# Patient Record
Sex: Male | Born: 1965 | Race: White | Hispanic: No | Smoking: Former smoker
Health system: Southern US, Community
[De-identification: ages and names within clinical notes are randomized; demographics above are authoritative.]

## PROBLEM LIST (undated history)

## (undated) DIAGNOSIS — I10 Essential (primary) hypertension: Secondary | ICD-10-CM

## (undated) DIAGNOSIS — M5430 Sciatica, unspecified side: Secondary | ICD-10-CM

## (undated) DIAGNOSIS — E785 Hyperlipidemia, unspecified: Secondary | ICD-10-CM

## (undated) DIAGNOSIS — M549 Dorsalgia, unspecified: Secondary | ICD-10-CM

## (undated) HISTORY — DX: Hyperlipidemia, unspecified: E78.5

## (undated) HISTORY — DX: Essential (primary) hypertension: I10

---

## 2010-07-08 ENCOUNTER — Emergency Department: Payer: Self-pay | Admitting: Emergency Medicine

## 2012-09-13 ENCOUNTER — Encounter (HOSPITAL_COMMUNITY): Payer: Self-pay | Admitting: *Deleted

## 2012-09-13 ENCOUNTER — Emergency Department (HOSPITAL_COMMUNITY): Payer: BC Managed Care – PPO

## 2012-09-13 ENCOUNTER — Other Ambulatory Visit: Payer: Self-pay

## 2012-09-13 ENCOUNTER — Observation Stay (HOSPITAL_COMMUNITY)
Admission: EM | Admit: 2012-09-13 | Discharge: 2012-09-13 | Disposition: A | Discharge status: Home / Self Care | Payer: BC Managed Care – PPO | Attending: Emergency Medicine | Admitting: Emergency Medicine

## 2012-09-13 ENCOUNTER — Observation Stay (HOSPITAL_COMMUNITY): Payer: BC Managed Care – PPO

## 2012-09-13 DIAGNOSIS — R209 Unspecified disturbances of skin sensation: Secondary | ICD-10-CM

## 2012-09-13 DIAGNOSIS — E785 Hyperlipidemia, unspecified: Secondary | ICD-10-CM

## 2012-09-13 DIAGNOSIS — Z79899 Other long term (current) drug therapy: Secondary | ICD-10-CM | POA: Insufficient documentation

## 2012-09-13 DIAGNOSIS — Z7982 Long term (current) use of aspirin: Secondary | ICD-10-CM | POA: Insufficient documentation

## 2012-09-13 DIAGNOSIS — G459 Transient cerebral ischemic attack, unspecified: Secondary | ICD-10-CM

## 2012-09-13 DIAGNOSIS — M549 Dorsalgia, unspecified: Secondary | ICD-10-CM | POA: Insufficient documentation

## 2012-09-13 DIAGNOSIS — I10 Essential (primary) hypertension: Secondary | ICD-10-CM | POA: Insufficient documentation

## 2012-09-13 HISTORY — DX: Dorsalgia, unspecified: M54.9

## 2012-09-13 HISTORY — DX: Sciatica, unspecified side: M54.30

## 2012-09-13 LAB — RAPID URINE DRUG SCREEN, HOSP PERFORMED
Amphetamines: NOT DETECTED
Benzodiazepines: NOT DETECTED
Opiates: NOT DETECTED

## 2012-09-13 LAB — URINALYSIS, ROUTINE W REFLEX MICROSCOPIC
Glucose, UA: NEGATIVE mg/dL
Ketones, ur: NEGATIVE mg/dL
Leukocytes, UA: NEGATIVE
Nitrite: NEGATIVE
pH: 6.5 (ref 5.0–8.0)

## 2012-09-13 LAB — POCT I-STAT, CHEM 8
BUN: 16 mg/dL (ref 6–23)
Chloride: 103 mEq/L (ref 96–112)
Creatinine, Ser: 1.3 mg/dL (ref 0.50–1.35)
Glucose, Bld: 110 mg/dL — ABNORMAL HIGH (ref 70–99)
Potassium: 4.2 mEq/L (ref 3.5–5.1)
Sodium: 140 mEq/L (ref 135–145)

## 2012-09-13 LAB — CBC
HCT: 48.4 % (ref 39.0–52.0)
Hemoglobin: 16.3 g/dL (ref 13.0–17.0)
MCV: 93.6 fL (ref 78.0–100.0)
Platelets: 254 10*3/uL (ref 150–400)
RBC: 5.17 MIL/uL (ref 4.22–5.81)
WBC: 8.5 10*3/uL (ref 4.0–10.5)

## 2012-09-13 LAB — LIPID PANEL
HDL: 40 mg/dL (ref >39)
Total CHOL/HDL Ratio: 5.3 RATIO
Triglycerides: 156 mg/dL — ABNORMAL HIGH (ref <150)

## 2012-09-13 LAB — BASIC METABOLIC PANEL
CO2: 27 mEq/L (ref 19–32)
Chloride: 100 mEq/L (ref 96–112)
Glucose, Bld: 105 mg/dL — ABNORMAL HIGH (ref 70–99)
Potassium: 4.1 mEq/L (ref 3.5–5.1)
Sodium: 139 mEq/L (ref 135–145)

## 2012-09-13 LAB — POCT I-STAT TROPONIN I

## 2012-09-13 LAB — TROPONIN I: Troponin I: <0.3 ng/mL (ref <0.30)

## 2012-09-13 MED ORDER — ATORVASTATIN CALCIUM 10 MG PO TABS: 10.0000 mg | ORAL_TABLET | Freq: Every day | ORAL | Status: DC

## 2012-09-13 MED ORDER — ASPIRIN 81 MG PO CHEW
162.0000 mg | CHEWABLE_TABLET | Freq: Once | ORAL | Status: AC
  Administered 2012-09-13: 162 mg via ORAL
  Filled 2012-09-13: qty 3

## 2012-09-13 NOTE — Progress Notes (Signed)
  Echocardiogram 2D Echocardiogram has been performed.  [place] 09/13/2012, 3:00 PM

## 2012-09-13 NOTE — Progress Notes (Signed)
*  PRELIMINARY RESULTS* Vascular Ultrasound Carotid Duplex (Doppler) has been completed.  There is no obvious evidence of extracranial carotid artery stenosis. Vertebral arteries are patent with antegrade flow.  [employer], RVS 09/13/2012

## 2012-09-13 NOTE — ED Provider Notes (Signed)
Patient to move to CDU under observation, TIA protocol.  This is a shared visit with Dr Rhunette Croft.  Patient with numbness and tingling along the entire left side of his body.  This has resolved.  ABCD2 score is 2.    2:08 PM Patient has arrived to CDU and taken directly to vascular lab.    3:23 PM Discussed patient with Fayrene Helper, PA-C, who assumes care of patient at change of shift.     [place], [place] 09/13/12 1523

## 2012-09-13 NOTE — ED Notes (Addendum)
Lt. Arm numbness. Tightness, upper lt. Shoulder. Hx. Of sciatic nerve injury. Twitching throughout body. 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 MRI this past week for lower back pain; given flexeril.

## 2012-09-13 NOTE — ED Notes (Signed)
On arrival to room A-13 pt reported he had Lt facial and Lt arm tingling. 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 Pt reported on his last jump he jerked his neck hard. Pt now works as a Architectural technologist.

## 2012-09-13 NOTE — ED Provider Notes (Signed)
TIA protocol due to L side paresthesia that has resolved.  NIH score is 0, ABCD score of 2.  Currently awaits imaging.    Patient's workup is only remarkable for mildly elevated total cholesterol of 210, and an LDL of 139.  Normal Head/Brain MRI/MRA.  Normal cardiac 2d echo.  The remainder of work up has been unremarkable.  He is currently sxs free. On examination he appeared in good health and spirits. Vital signs as documented. Skin warm and dry and without overt rashes. Neck without JVD. Lungs clear. Heart exam notable for regular rhythm, normal sounds and absence of murmurs, rubs or gallops. Abdomen unremarkable and without evidence of organomegaly, masses, or abdominal aortic enlargement. Extremities nonedematous. Neurologically, the patient was awake, alert, and oriented to person, place and time. There were no obvious focal neurologic abnormalities. Pt will be discharged with statin medication, and recommend f/u with PCP for further care.  Neurology referral given as well.  Patient voiced understanding and agrees with plan  BP 132/94  Pulse 94  Temp 98.6 F (37 C) (Oral)  Resp 14  SpO2 97%    Author:  Gwendolyn Fill  Service:  Vascular Lab  Author Type:  Cardiovascular Sonographer   Filed:  09/13/12 1620  Note Time:  09/13/12 1619          *PRELIMINARY RESULTS*  Vascular Ultrasound  Carotid Duplex (Doppler) has been completed.  There is no obvious evidence of extracranial carotid artery stenosis. Vertebral arteries are patent with antegrade flow.  [employer], RVS  09/13/2012       Results for orders placed during the hospital encounter of 09/13/12  CBC      Component Value Range   WBC 8.5  4.0 - 10.5 K/uL   RBC 5.17  4.22 - 5.81 MIL/uL   Hemoglobin 16.3  13.0 - 17.0 g/dL   HCT 96.0  45.4 - 09.8 %   MCV 93.6  78.0 - 100.0 fL   MCH 31.5  26.0 - 34.0 pg   MCHC 33.7  30.0 - 36.0 g/dL   RDW 11.9  14.7 - 82.9 %   Platelets 254  150 - 400 K/uL  POCT I-STAT, CHEM 8        Component Value Range   Sodium 140  135 - 145 mEq/L   Potassium 4.2  3.5 - 5.1 mEq/L   Chloride 103  96 - 112 mEq/L   BUN 16  6 - 23 mg/dL   Creatinine, Ser 5.62  0.50 - 1.35 mg/dL   Glucose, Bld 130 (*) 70 - 99 mg/dL   Calcium, Ion 8.65  1.12 - 1.23 mmol/L   TCO2 24  0 - 100 mmol/L   Hemoglobin 17.3 (*) 13.0 - 17.0 g/dL   HCT 78.4  69.6 - 29.5 %  POCT I-STAT TROPONIN I      Component Value Range   Troponin i, poc 0.01  0.00 - 0.08 ng/mL   Comment 3           BASIC METABOLIC PANEL      Component Value Range   Sodium 139  135 - 145 mEq/L   Potassium 4.1  3.5 - 5.1 mEq/L   Chloride 100  96 - 112 mEq/L   CO2 27  19 - 32 mEq/L   Glucose, Bld 105 (*) 70 - 99 mg/dL   BUN 15  6 - 23 mg/dL   Creatinine, Ser 2.84  0.50 - 1.35 mg/dL   Calcium 9.4  8.4 - 10.5 mg/dL   GFR calc non Af Amer 67 (*) >90 mL/min   GFR calc Af Amer 78 (*) >90 mL/min  TROPONIN I      Component Value Range   Troponin I <0.30  <0.30 ng/mL  URINALYSIS, ROUTINE W REFLEX MICROSCOPIC      Component Value Range   Color, Urine YELLOW  YELLOW   APPearance CLEAR  CLEAR   Specific Gravity, Urine 1.016  1.005 - 1.030   pH 6.5  5.0 - 8.0   Glucose, UA NEGATIVE  NEGATIVE mg/dL   Hgb urine dipstick NEGATIVE  NEGATIVE   Bilirubin Urine NEGATIVE  NEGATIVE   Ketones, ur NEGATIVE  NEGATIVE mg/dL   Protein, ur NEGATIVE  NEGATIVE mg/dL   Urobilinogen, UA 0.2  0.0 - 1.0 mg/dL   Nitrite NEGATIVE  NEGATIVE   Leukocytes, UA NEGATIVE  NEGATIVE  URINE RAPID DRUG SCREEN (HOSP PERFORMED)      Component Value Range   Opiates NONE DETECTED  NONE DETECTED   Cocaine NONE DETECTED  NONE DETECTED   Benzodiazepines NONE DETECTED  NONE DETECTED   Amphetamines NONE DETECTED  NONE DETECTED   Tetrahydrocannabinol NONE DETECTED  NONE DETECTED   Barbiturates NONE DETECTED  NONE DETECTED  LIPID PANEL      Component Value Range   Cholesterol 210 (*) 0 - 200 mg/dL   Triglycerides 960 (*) <150 mg/dL   HDL 40  >45 mg/dL   Total CHOL/HDL  Ratio 5.3     VLDL 31  0 - 40 mg/dL   LDL Cholesterol 409 (*) 0 - 99 mg/dL   Dg Chest 2 View  81/19/1478  *RADIOLOGY REPORT*  Clinical Data: Hypertension, numbness, tingling in left body.  CHEST - 2 VIEW  Comparison: None.  Findings: Heart and mediastinal contours are within normal limits. No focal opacities or effusions.  No acute bony abnormality.  IMPRESSION: No active cardiopulmonary disease.   Original Report Authenticated By: Cyndie Chime, M.D.    Ct Head Wo Contrast  09/13/2012  *RADIOLOGY REPORT*  Clinical Data:  Left facial and left arm tingling.  CT HEAD WITHOUT CONTRAST  Technique: Contiguous axial images were obtained from the base of the skull through the vertex without intravenous contrast.  Comparison:   None.  Findings:  Ventricle size is normal.  Negative for acute or chronic infarct.  Negative for hemorrhage or mass.  Calvarium is intact.  IMPRESSION: Negative exam.   Original Report Authenticated By: Camelia Phenes, M.D.    Mr Brain Wo Contrast  09/13/2012  *RADIOLOGY REPORT*  Clinical Data:  Left sided numbness.  Hypertension.  Rule out CVA  MRI HEAD WITHOUT CONTRAST MRA HEAD WITHOUT CONTRAST  Technique:  Multiplanar, multiecho pulse sequences of the brain and surrounding structures were obtained without intravenous contrast. Angiographic images of the head were obtained using MRA technique without contrast.  Comparison:  CT 09/13/2012  MRI HEAD  Findings:  Negative for acute infarct.  Negative for chronic ischemia.  No evidence of demyelinating disease.  Cerebral white matter is normal.  The basal ganglia and brainstem are normal.  Negative for intracranial hemorrhage or fluid collection.  No mass or edema is present in the brain.  No shift of the midline structures.  Mild mucosal edema in the paranasal sinuses.  IMPRESSION: Normal MRI of the brain  Mild chronic sinusitis  MRA HEAD  Findings: Dominant left vertebral artery.  Hypoplastic right vertebral artery is patent to the  basilar.  PICA is  patent bilaterally.  The basilar is patent.  Fetal origin of the posterior cerebral artery with hypoplastic right P1 segment.  Superior cerebellar and posterior cerebral arteries are patent bilaterally.  Internal carotid artery is widely patent bilaterally without stenosis.  Anterior and middle cerebral arteries are patent bilaterally.  Negative for cerebral aneurysm.  IMPRESSION: Negative   Original Report Authenticated By: Camelia Phenes, M.D.    Mr Mra Head/brain Wo Cm  09/13/2012  *RADIOLOGY REPORT*  Clinical Data:  Left sided numbness.  Hypertension.  Rule out CVA  MRI HEAD WITHOUT CONTRAST MRA HEAD WITHOUT CONTRAST  Technique:  Multiplanar, multiecho pulse sequences of the brain and surrounding structures were obtained without intravenous contrast. Angiographic images of the head were obtained using MRA technique without contrast.  Comparison:  CT 09/13/2012  MRI HEAD  Findings:  Negative for acute infarct.  Negative for chronic ischemia.  No evidence of demyelinating disease.  Cerebral white matter is normal.  The basal ganglia and brainstem are normal.  Negative for intracranial hemorrhage or fluid collection.  No mass or edema is present in the brain.  No shift of the midline structures.  Mild mucosal edema in the paranasal sinuses.  IMPRESSION: Normal MRI of the brain  Mild chronic sinusitis  MRA HEAD  Findings: Dominant left vertebral artery.  Hypoplastic right vertebral artery is patent to the basilar.  PICA is patent bilaterally.  The basilar is patent.  Fetal origin of the posterior cerebral artery with hypoplastic right P1 segment.  Superior cerebellar and posterior cerebral arteries are patent bilaterally.  Internal carotid artery is widely patent bilaterally without stenosis.  Anterior and middle cerebral arteries are patent bilaterally.  Negative for cerebral aneurysm.  IMPRESSION: Negative   Original Report Authenticated By: Camelia Phenes, M.D.       Fayrene Helper,  PA-C 09/13/12 1659

## 2012-09-13 NOTE — ED Notes (Signed)
PATIENT IS ALERT AND ORIENTED. UPON ARRIVAL TO CDU PT TAKEN IMMEDIATELY TO VASCULAR LAB FOR STUDIES. THEY ARE GOING TO CHECK WITH MRI ALSO TO SEE IF THEY WILL BE READY FOR PT

## 2012-09-13 NOTE — ED Provider Notes (Signed)
History     CSN: [id number]  Arrival date & time 09/13/12  0945   First MD Initiated Contact with Patient 09/13/12 1039      Chief Complaint  Patient presents with  . Hypertension  . Numbness    (Consider location/radiation/quality/duration/timing/severity/associated sxs/prior treatment) HPI Comments: Pt comes in with cc of numbness. Has no medical problems. Pt reports that around 9 am, he was getting ready to go for a hair cut, and started having numbness in the entire left side and some tingling. His sx have subsided, and now he has numbness only to the leftupper extremity. The sx lasted at least 15 minutes before startign to subside. He has no weakness, no substance abuse hx.  Patient is a 46 y.o. male presenting with hypertension. The history is provided by the patient.  Hypertension Pertinent negatives include no chest pain, no abdominal pain and no shortness of breath.    Past Medical History  Diagnosis Date  . Back pain   . Sciatic nerve pain     No past surgical history on file.  No family history on file.  History  Substance Use Topics  . Smoking status: Former Games developer  . Smokeless tobacco: Not on file  . Alcohol Use: Yes      Review of Systems  Constitutional: Negative for activity change and appetite change.  Respiratory: Negative for cough and shortness of breath.   Cardiovascular: Negative for chest pain.  Gastrointestinal: Negative for abdominal pain.  Genitourinary: Negative for dysuria.  Neurological: Positive for numbness.    Allergies  Sulfa antibiotics  Home Medications   Current Outpatient Rx  Name Route Sig Dispense Refill  . ASPIRIN 325 MG PO TABS Oral Take 325 mg by mouth once as needed. For pain    . CYCLOBENZAPRINE HCL 10 MG PO TABS Oral Take 10 mg by mouth 3 (three) times daily as needed. For muscle spasms.      BP 139/98  Pulse 91  Temp 97.9 F (36.6 C) (Oral)  Resp 13  SpO2 97%  Physical Exam  Nursing note and vitals  reviewed. Constitutional: He is oriented to person, place, and time. He appears well-developed and well-nourished.  HENT:  Head: Normocephalic and atraumatic.  Eyes: Conjunctivae normal and EOM are normal. Pupils are equal, round, and reactive to light.  Neck: Normal range of motion. Neck supple. No JVD present.  Cardiovascular: Normal rate and regular rhythm.   Pulmonary/Chest: Effort normal and breath sounds normal. No respiratory distress. He has no wheezes.  Abdominal: Soft. Bowel sounds are normal. He exhibits no distension. There is no tenderness. There is no rebound and no guarding.  Neurological: He is alert and oriented to person, place, and time. No cranial nerve deficit. Coordination normal.       NIHSS - 0 No objective sensory deficits, Motor strength upper and lower extremity 4+ and equal Normal cerebellar exam Pt has some tingling on the left upper extrermity  Skin: Skin is warm and dry.    ED Course  Procedures (including critical care time)  Labs Reviewed  POCT I-STAT, CHEM 8 - Abnormal; Notable for the following:    Glucose, Bld 110 (*)     Hemoglobin 17.3 (*)     All other components within normal limits  CBC  POCT I-STAT TROPONIN I  BASIC METABOLIC PANEL  TROPONIN I  URINALYSIS, ROUTINE W REFLEX MICROSCOPIC  URINE RAPID DRUG SCREEN (HOSP PERFORMED)   Dg Chest 2 View  09/13/2012  *RADIOLOGY REPORT*  Clinical Data: Hypertension, numbness, tingling in left body.  CHEST - 2 VIEW  Comparison: None.  Findings: Heart and mediastinal contours are within normal limits. No focal opacities or effusions.  No acute bony abnormality.  IMPRESSION: No active cardiopulmonary disease.   Original Report Authenticated By: Cyndie Chime, M.D.      No diagnosis found.    MDM  DDx includes:  Stroke - ischemic vs. hemorrhagic TIA Neuropathy Electrolyte abnormality Neuropathy  Pt comes in with cc of unilateral numbness and tingling. He has hx of neck and back problems,  but never had sx like these before. He is not completely sx free. ABCD2 SCORE IS 2. Will give ASA, and he will get Neuro consult for possible MRI      Derwood Kaplan, MD 09/13/12 1147

## 2012-09-14 LAB — HEMOGLOBIN A1C
Hgb A1c MFr Bld: 5.7 % — ABNORMAL HIGH (ref <5.7)
Mean Plasma Glucose: 117 mg/dL — ABNORMAL HIGH (ref <117)

## 2012-09-19 ENCOUNTER — Encounter (HOSPITAL_COMMUNITY): Payer: Self-pay | Admitting: Emergency Medicine

## 2012-09-19 ENCOUNTER — Emergency Department (HOSPITAL_COMMUNITY)
Admission: EM | Admit: 2012-09-19 | Discharge: 2012-09-19 | Disposition: A | Discharge status: Home / Self Care | Payer: BC Managed Care – PPO | Attending: Emergency Medicine | Admitting: Emergency Medicine

## 2012-09-19 DIAGNOSIS — R209 Unspecified disturbances of skin sensation: Secondary | ICD-10-CM | POA: Insufficient documentation

## 2012-09-19 DIAGNOSIS — R202 Paresthesia of skin: Secondary | ICD-10-CM

## 2012-09-19 DIAGNOSIS — M5430 Sciatica, unspecified side: Secondary | ICD-10-CM | POA: Insufficient documentation

## 2012-09-19 DIAGNOSIS — Z7982 Long term (current) use of aspirin: Secondary | ICD-10-CM | POA: Insufficient documentation

## 2012-09-19 DIAGNOSIS — Z87891 Personal history of nicotine dependence: Secondary | ICD-10-CM | POA: Insufficient documentation

## 2012-09-19 DIAGNOSIS — Z79899 Other long term (current) drug therapy: Secondary | ICD-10-CM | POA: Insufficient documentation

## 2012-09-19 NOTE — ED Provider Notes (Signed)
Medical screening examination/treatment/procedure(s) were conducted as a shared visit with non-physician practitioner(s) and myself.  I personally evaluated the patient during the encounter  Derwood Kaplan, MD 09/19/12 [phone]

## 2012-09-19 NOTE — ED Notes (Signed)
To ED from work via EMS, pt c/o pain from left neck down left side of body onset this AM, EMS reports neg stroke scale, pt also reports similar episode on right side 2d ago, VSS, NAD

## 2012-09-19 NOTE — ED Provider Notes (Signed)
Medical screening examination/treatment/procedure(s) were conducted as a shared visit with non-physician practitioner(s) and myself.  I personally evaluated the patient during the encounter  David Beltre, MD 09/19/12 1508 

## 2012-09-19 NOTE — ED Provider Notes (Signed)
History     CSN: [id number]  Arrival date & time 09/19/12  1032   First MD Initiated Contact with Patient 09/19/12 1052      Chief Complaint  Patient presents with  . Weakness    (Consider location/radiation/quality/duration/timing/severity/associated sxs/prior treatment) HPI CC: tingling in arms and funny feeling  Tingling down arms L and R intermittent since last ED eval on 10/18. Occur at random. Now associated w/ subsequent feelings of dry mouth, nausea, increased heart rate and a general "funny feeling." Also feels like his R side goes numb. Improves w/ lying down. Acute "attack" today at the office. Pt felt attack coming on while at work. Acutely worsened over several minutes, felt like he couldn't walk. Symptoms were severe for about 10 min before becoming only mild. Currently w/ only slight nubmness/tingling in both arms. Denies HA, fever, diarrhea, emesis, rash, syncope, lightheadedness, CP, SOB   Past Medical History  Diagnosis Date  . Back pain   . Sciatic nerve pain     History reviewed. No pertinent past surgical history.  History reviewed. No pertinent family history.  History  Substance Use Topics  . Smoking status: Former Games developer  . Smokeless tobacco: Not on file  . Alcohol Use: Yes      Review of Systems  All other systems reviewed and are negative.    Allergies  Sulfa antibiotics  Home Medications   Current Outpatient Rx  Name Route Sig Dispense Refill  . VITAMIN C PO Oral Take 1 tablet by mouth daily.    . ASPIRIN 325 MG PO TABS Oral Take 325 mg by mouth every 4 (four) hours as needed. For stoke-like syptoms    . CYCLOBENZAPRINE HCL 10 MG PO TABS Oral Take 10 mg by mouth 3 (three) times daily as needed. For muscle spasms.      BP 132/98  Pulse 87  Temp 98 F (36.7 C) (Oral)  Resp 20  SpO2 98%  Physical Exam  Nursing note and vitals reviewed. Constitutional: He is oriented to person, place, and time. He appears well-developed and  well-nourished. No distress.  HENT:  Head: Normocephalic and atraumatic.  Eyes: Pupils are equal, round, and reactive to light.  Neck: Normal range of motion.  Cardiovascular: Normal rate and intact distal pulses.   Pulmonary/Chest: No respiratory distress.  Abdominal: Normal appearance. He exhibits no distension.  Musculoskeletal: Normal range of motion.  Neurological: He is alert and oriented to person, place, and time. No cranial nerve deficit. Coordination normal.  Skin: Skin is warm and dry. No rash noted.  Psychiatric: He has a normal mood and affect. His behavior is normal.    ED Course  Procedures (including critical care time)  Labs Reviewed - No data to display No results found.   No diagnosis found.    MDM   46yo M w/ unusual presentation. Full TIA workup as of 10/18 that was unremarkable. Upper arm numbness/tingling more consistent w/ cervical radiculopathy. Other symptoms more consistent w/ aura type migraine but w/o migraine vs psychosomatic vs early onset MS vs electrolyte abnormality. - f/u PCP - f/u Neuro  Shelly Flatten, MD Family Medicine PGY-2 09/19/2012, 11:31 AM         Ozella Rocks, MD 09/19/12 [phone]

## 2012-09-19 NOTE — ED Provider Notes (Signed)
I saw and evaluated the patient, reviewed the resident's note and I agree with the findings and plan.  Pt reports numbness/tingling in L side of body from head, arm and leg. Onset earlier today while at work. Had similar symptoms on the R a few days ago and seen in the ED. Had a full TIA workup which was neg. He reports he is scheduled for nerve conduction study tomorrow with PCP in [place]. Also advised to follow up with Neurologist for further outpatient eval. No indication for additional ED workup/   Charles B. Bernette Mayers, MD 09/19/12 1239

## 2013-01-29 ENCOUNTER — Encounter: Payer: Self-pay | Admitting: Family Medicine

## 2013-01-29 ENCOUNTER — Ambulatory Visit (INDEPENDENT_AMBULATORY_CARE_PROVIDER_SITE_OTHER): Payer: PRIVATE HEALTH INSURANCE | Admitting: Family Medicine

## 2013-01-29 VITALS — BP 140/88 | HR 80 | Temp 98.6°F | Resp 16 | Ht 71.0 in | Wt 228.5 lb

## 2013-01-29 DIAGNOSIS — M5010 Cervical disc disorder with radiculopathy, unspecified cervical region: Secondary | ICD-10-CM

## 2013-01-29 DIAGNOSIS — IMO0002 Reserved for concepts with insufficient information to code with codable children: Secondary | ICD-10-CM

## 2013-01-29 DIAGNOSIS — M5412 Radiculopathy, cervical region: Secondary | ICD-10-CM

## 2013-01-29 DIAGNOSIS — E785 Hyperlipidemia, unspecified: Secondary | ICD-10-CM

## 2013-01-29 DIAGNOSIS — I10 Essential (primary) hypertension: Secondary | ICD-10-CM

## 2013-01-29 DIAGNOSIS — M5416 Radiculopathy, lumbar region: Secondary | ICD-10-CM

## 2013-01-29 HISTORY — DX: Hyperlipidemia, unspecified: E78.5

## 2013-01-29 MED ORDER — IBUPROFEN 800 MG PO TABS: 800.0000 mg | ORAL_TABLET | Freq: Three times a day (TID) | ORAL | Status: DC | PRN

## 2013-01-29 MED ORDER — AMITRIPTYLINE HCL 10 MG PO TABS: 10.0000 mg | ORAL_TABLET | Freq: Every day | ORAL | Status: DC

## 2013-01-29 NOTE — Progress Notes (Signed)
Nature conservation officer at [facility] [address] [place] [address] Phone: [phone] Fax: [phone]  Date:  01/29/2013   Name:  David Collins   DOB:  January 08, 1966   MRN:  [id number] Gender: male Age: 47 y.o.  Primary Physician:  Hannah Beat, MD  Evaluating MD: Hannah Beat, MD   Chief Complaint: Establish Care   History of Present Illness:  David Collins is a 47 y.o. pleasant patient who presents with the following:  New patient, former patient of Dr. Lacie Scotts who presents as as a new patient, but he also has multiple ongoing significant impairing problems. He is a former Dance movement psychotherapist, and he currently is having some significant paresthesias constantly in the left arm, and he also is having some fasciculations and twitching of his fingers in his right left hand. He also has some subjective weakness in the left arm, more in the bicep. He is having these symptoms almost all the time. He also is having some back pain, and is having some tingling in his lower extremities as well intermittently.  He has been to the emergency room twice, and they have evaluated him for potential CVA, potential demyelinating disease, and he had a normal MRI, normal MRA, and normal CT of the head. He followed up with [facility], and it sounds likely with Dr. Terrace Arabia.   Would have some sensations in his legs like worms.  He also discussed this with Dr. Lacie Scotts, who obtained an EMG that was mild-moderately abnormal with regards to the upper extremities.   He also had cervical and lumbar MRI's done at [facility]. I independently reviewed these myself. There some significant disc herniation at C5-6 and C6-7. These will be big enough to cause some symptoms. There does not appear to be any T2 edema in the spinal cord itself. There is also some very minimal herniation in the 2 levels above this.  On review of the lumbar spine MRI, there does not appear to be any dramatically  significant herniation. And no apparent cord or nerve compression.   CHEST - 2 VIEW   Comparison: None.   Findings: Heart and mediastinal contours are within normal limits. No focal opacities or effusions.  No acute bony abnormality.   IMPRESSION: No active cardiopulmonary disease.     Original Report Authenticated By: Cyndie Chime, M.D. CT HEAD WITHOUT CONTRAST   Technique: Contiguous axial images were obtained from the base of the skull through the vertex without intravenous contrast.   Comparison:   None.   Findings:  Ventricle size is normal.  Negative for acute or chronic infarct.  Negative for hemorrhage or mass.  Calvarium is intact.   IMPRESSION: Negative exam.     Original Report Authenticated By: Camelia Phenes, M.D. Clinical Data:  Left sided numbness.  Hypertension.  Rule out CVA   MRI HEAD WITHOUT CONTRAST MRA HEAD WITHOUT CONTRAST   Technique:  Multiplanar, multiecho pulse sequences of the brain and surrounding structures were obtained without intravenous contrast. Angiographic images of the head were obtained using MRA technique without contrast.   Comparison:  CT 09/13/2012   MRI HEAD   Findings:  Negative for acute infarct.  Negative for chronic ischemia.  No evidence of demyelinating disease.  Cerebral white matter is normal.  The basal ganglia and brainstem are normal.   Negative for intracranial hemorrhage or fluid collection.  No mass or edema is present in the brain.  No shift of the midline structures.   Mild  mucosal edema in the paranasal sinuses.   IMPRESSION: Normal MRI of the brain   Mild chronic sinusitis   MRA HEAD   Findings: Dominant left vertebral artery.  Hypoplastic right vertebral artery is patent to the basilar.  PICA is patent bilaterally.  The basilar is patent.  Fetal origin of the posterior cerebral artery with hypoplastic right P1 segment.  Superior cerebellar and posterior cerebral arteries are patent  bilaterally.   Internal carotid artery is widely patent bilaterally without stenosis.  Anterior and middle cerebral arteries are patent bilaterally.   Negative for cerebral aneurysm.   IMPRESSION: Negative     Original Report Authenticated By: Camelia Phenes, M.D.   Patient Active Problem List  Diagnosis  . Lumbar radiculopathy  . Cervical disc disorder with radiculopathy of cervical region  . Hyperlipidemia  . Hypertension    Past Medical History  Diagnosis Date  . Back pain   . Sciatic nerve pain   . Hyperlipidemia 01/29/2013  . Hypertension     No past surgical history on file.  History   Social History  . Marital Status: Divorced    Spouse Name: N/A    Number of Children: N/A  . Years of Education: N/A   Occupational History  .  Scientist, product/process development   Social History Main Topics  . Smoking status: Former Games developer  . Smokeless tobacco: Not on file  . Alcohol Use: Yes  . Drug Use: No  . Sexually Active:    Other Topics Concern  . 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 Skin cancer      parents    Allergies  Allergen Reactions  . Sulfa Antibiotics Rash    Current Outpatient Prescriptions on File Prior to Visit  Medication Sig Dispense Refill  . Ascorbic Acid (VITAMIN C PO) Take 1 tablet by mouth daily.       No current facility-administered medications on file prior to visit.     Review of Systems:   GEN: No fevers, chills. Nontoxic. Primarily MSK c/o today. MSK: Detailed in the HPI GI: tolerating PO intake without difficulty Neuro: detailed above Otherwise, the pertinent positives and negatives are listed above and in the HPI, otherwise a full review of systems has been reviewed and is negative unless noted positive.   Physical Examination: Filed Vitals:   01/29/13 1357 01/29/13 1811  BP: 140/88 140/88    Pulse: 118 80  Temp: 98.6 F (37 C) 98.6 F (37 C)  TempSrc: Oral Oral  Resp:  16  Height: 5\' 11"  (1.803 m)   Weight: 228 lb 8 oz (103.647 kg)   SpO2: 96%      Ideal Body Weight: Weight in (lb) to have BMI = 25: 178.9   GEN: Well-developed,well-nourished,in no acute distress; alert,appropriate and cooperative throughout examination HEENT: Normocephalic and atraumatic without obvious abnormalities. Ears, externally no deformities PULM: Breathing comfortably in no respiratory distress EXT: No clubbing, cyanosis, or edema PSYCH: Normally interactive. Cooperative during the interview. Pleasant. Friendly and conversant. Not anxious or depressed appearing. Normal, full affect.  CERVICAL SPINE EXAM Range of motion: Flexion, extension, lateral bending, and rotation: relatively preserved Pain with terminal motion: yes Spinous Processes: tender throughout SCM: ttp Upper paracervical muscles: ttp Upper traps: ttp C5-T1 intact, sensation and motor - appears intact to me, but he subjectively feels  weaker in the L UE  Lumbar spine: Moderately tender right and left side in the paraspinous musculature. No obvious deficits are appreciated from a neurovascular standpoint.  Neuro: The patient is having some occasional twitching of his fingers that he shows me.   Assessment and Plan:  Cervical disc disorder with radiculopathy of cervical region - Plan: Ambulatory referral to Neurosurgery  Lumbar radiculopathy - Plan: Ambulatory referral to Neurosurgery  Hyperlipidemia  Hypertension  I am quite concerned with the neuropathic changes of this patient is having given that he is a former Camera operator, but these symptoms are fairly impairing to him right now. He does have a large enough herniation in his cervical spine that I think could be causing these symptoms. I discussed with him that it is not massive and there is clearly no cord compromise.   I started him on some low-dose Elavil to  see if this will help with the neuropathic pain.  Also like to consult neurosurgery to get their opinion if they think anything can be addressed from a procedural standpoint or if he may be more amenable to an epidural steroid injection. Anything that they can suggest that could potentially help this gentleman would be greatly appreciated.  Orders Today:  Orders Placed This Encounter  Procedures  . Ambulatory referral to Neurosurgery    Referral Priority:  Routine    Referral Type:  Surgical    Referral Reason:  Specialty Services Required    Requested Specialty:  Neurosurgery    Number of Visits Requested:  1    Updated Medication List: (Includes new medications, updates to list, dose adjustments) Meds ordered this encounter  Medications  . pantoprazole (PROTONIX) 40 MG tablet    Sig: Take 40 mg by mouth daily.  . simvastatin (ZOCOR) 20 MG tablet    Sig: Take 20 mg by mouth every evening.  Marland Kitchen DISCONTD: meloxicam (MOBIC) 7.5 MG tablet    Sig: Take 7.5 mg by mouth as needed for pain.  . traMADol (ULTRAM) 50 MG tablet    Sig: Take 50 mg by mouth every 6 (six) hours as needed for pain.  Marland Kitchen DISCONTD: gabapentin (NEURONTIN) 400 MG capsule    Sig: Take 400 mg by mouth 3 (three) times daily.  Marland Kitchen amitriptyline (ELAVIL) 10 MG tablet    Sig: Take 1 tablet (10 mg total) by mouth at bedtime.    Dispense:  30 tablet    Refill:  5  . ibuprofen (ADVIL,MOTRIN) 800 MG tablet    Sig: Take 1 tablet (800 mg total) by mouth every 8 (eight) hours as needed for pain.    Dispense:  90 tablet    Refill:  3    Medications Discontinued: Medications Discontinued During This Encounter  Medication Reason  . aspirin 325 MG tablet Error  . cyclobenzaprine (FLEXERIL) 10 MG tablet Error  . gabapentin (NEURONTIN) 400 MG capsule   . meloxicam (MOBIC) 7.5 MG tablet       Signed, Spencer T. Copland, MD 01/29/2013 2:11 PM

## 2013-01-29 NOTE — Patient Instructions (Addendum)
REFERRAL: GO THE THE FRONT ROOM AT THE ENTRANCE OF OUR CLINIC, NEAR CHECK IN. ASK FOR MARION. SHE WILL HELP YOU SET UP YOUR REFERRAL. DATE: TIME:  

## 2013-02-26 ENCOUNTER — Ambulatory Visit: Payer: PRIVATE HEALTH INSURANCE | Admitting: Family Medicine

## 2013-10-10 ENCOUNTER — Encounter: Payer: Self-pay | Admitting: Family Medicine

## 2013-10-10 ENCOUNTER — Ambulatory Visit (INDEPENDENT_AMBULATORY_CARE_PROVIDER_SITE_OTHER): Payer: Self-pay | Admitting: Family Medicine

## 2013-10-10 VITALS — BP 110/76 | HR 114 | Temp 98.5°F | Ht 71.0 in | Wt 223.5 lb

## 2013-10-10 DIAGNOSIS — R1032 Left lower quadrant pain: Secondary | ICD-10-CM | POA: Insufficient documentation

## 2013-10-10 LAB — COMPREHENSIVE METABOLIC PANEL
ALT: 17 U/L (ref 0–53)
CO2: 30 mEq/L (ref 19–32)
Chloride: 103 mEq/L (ref 96–112)
Sodium: 137 mEq/L (ref 135–145)
Total Protein: 7.5 g/dL (ref 6.0–8.3)

## 2013-10-10 LAB — CBC WITH DIFFERENTIAL/PLATELET
Basophils Relative: 0.3 % (ref 0.0–3.0)
Eosinophils Absolute: 0.2 10*3/uL (ref 0.0–0.7)
Eosinophils Relative: 1.5 % (ref 0.0–5.0)
Hemoglobin: 15.7 g/dL (ref 13.0–17.0)
MCHC: 33.7 g/dL (ref 30.0–36.0)
MCV: 90.8 fl (ref 78.0–100.0)
Monocytes Absolute: 1.4 10*3/uL — ABNORMAL HIGH (ref 0.1–1.0)
Neutro Abs: 10 10*3/uL — ABNORMAL HIGH (ref 1.4–7.7)
Neutrophils Relative %: 67.1 % (ref 43.0–77.0)
RBC: 5.11 Mil/uL (ref 4.22–5.81)
WBC: 14.9 10*3/uL — ABNORMAL HIGH (ref 4.5–10.5)

## 2013-10-10 LAB — POCT URINALYSIS DIPSTICK
Ketones, UA: NEGATIVE
Leukocytes, UA: NEGATIVE

## 2013-10-10 MED ORDER — CIPROFLOXACIN HCL 750 MG PO TABS: 750.0000 mg | ORAL_TABLET | Freq: Two times a day (BID) | ORAL | Status: DC

## 2013-10-10 MED ORDER — METRONIDAZOLE 500 MG PO TABS: 500.0000 mg | ORAL_TABLET | Freq: Four times a day (QID) | ORAL | Status: DC

## 2013-10-10 NOTE — Progress Notes (Signed)
Pre-visit discussion using our clinic review tool. No additional management support is needed unless otherwise documented below in the visit note.  

## 2013-10-10 NOTE — Patient Instructions (Addendum)
Start cipro and flagyl for 10 days.. complete all even if feeling better.  Stop at lab on way out.  Rest, push fluids.  Follow up next week for re-eval unless completely better. Call sooner if fever.  If severe abdominal pain go to ER.

## 2013-10-10 NOTE — Progress Notes (Signed)
  Subjective:    Patient ID: David Collins, male    DOB: May 30, 1966, 47 y.o.   MRN: [id number]  HPI  47 year old male pot of Dr. Cyndie Chime presents with  Sudden onset while sleeping at night 2 days ago... Mid central abdominal pain.  Pain continued since then,  Greatest in left mid abdomen.  Pain 4/10 on pain scale. No D/ N/V.  No blood in stool, no blood in urine.  No dysuria.  Abdominal pain is gradually getting slightly better.  He has had temp of 101. F  Yesterday and last night. Chills. No URI symptoms. He had a lot of coffee before OV today.   Never had colonoscopy for any reason.  He uses tramadol for neck and back pain.   He has no insurance except through [place] and so cannot have any studies today.        Review of Systems  Constitutional: Positive for fever and fatigue.  HENT: Negative for ear pain.   Eyes: Negative for pain.  Respiratory: Negative for cough, shortness of breath and wheezing.   Cardiovascular: Negative for chest pain.  Gastrointestinal: Positive for abdominal pain. Negative for abdominal distention.       Objective:   Physical Exam  Constitutional: Vital signs are normal. He appears well-developed and well-nourished.  HENT:  Head: Normocephalic.  Right Ear: Hearing normal.  Left Ear: Hearing normal.  Nose: Nose normal.  Mouth/Throat: Oropharynx is clear and moist and mucous membranes are normal.  Neck: Trachea normal. Carotid bruit is not present. No mass and no thyromegaly present.  Cardiovascular: Normal rate, regular rhythm and normal pulses.  Exam reveals no gallop, no distant heart sounds and no friction rub.   No murmur heard. No peripheral edema  Pulmonary/Chest: Effort normal and breath sounds normal. No respiratory distress.  Abdominal: There is tenderness in the suprapubic area and left lower quadrant. There is no rigidity, no rebound, no guarding and no CVA tenderness.  Skin: Skin is warm, dry and intact. No rash noted.   Psychiatric: He has a normal mood and affect. His speech is normal and behavior is normal. Thought content normal.          Assessment & Plan:

## 2013-10-10 NOTE — Assessment & Plan Note (Addendum)
UA clear except bilirubin. Most consistent with diverticulitis. Will eval with labs ( check liver function)  Recommend CT abd pelvis to eval further.. Pt cannot have this done due to cost.. Can get through [place] possibly at a a later time. Given more emergent need.. We will treat empirically for diverticulitis with  cipro and flagyl and have pt follow up closely next week,  Push fluids, rest. If severe pain or no resolution of fever he is to go to ER.

## 2014-02-20 ENCOUNTER — Ambulatory Visit (INDEPENDENT_AMBULATORY_CARE_PROVIDER_SITE_OTHER): Payer: 59 | Admitting: Family Medicine

## 2014-02-20 ENCOUNTER — Encounter: Payer: Self-pay | Admitting: Family Medicine

## 2014-02-20 VITALS — BP 110/80 | HR 124 | Temp 100.2°F | Ht 71.0 in | Wt 229.0 lb

## 2014-02-20 DIAGNOSIS — J069 Acute upper respiratory infection, unspecified: Secondary | ICD-10-CM

## 2014-02-20 DIAGNOSIS — R509 Fever, unspecified: Secondary | ICD-10-CM

## 2014-02-20 LAB — POCT INFLUENZA A/B
INFLUENZA A, POC: NEGATIVE
Influenza B, POC: NEGATIVE

## 2014-02-20 NOTE — Assessment & Plan Note (Signed)
Negative flu. Labs from [place] pending. Push fluids as mild dehydration likely cause of tachycardia. Tylenol or ibuprofen for pain/fever.

## 2014-02-20 NOTE — Progress Notes (Signed)
   Subjective:    Patient ID: David Collins, male    DOB: [birth year], 48 y.o.   MRN: [id number]  Fever  This is a new problem. The current episode started in the past 7 days. The problem has been gradually worsening. The maximum temperature noted was 100 to 100.9 F. Associated symptoms include coughing. Pertinent negatives include no ear pain, headaches, rash, sore throat or wheezing. He has tried acetaminophen and NSAIDs for the symptoms.  Cough The current episode started in the past 7 days. The problem has been gradually worsening. The cough is non-productive. Associated symptoms include a fever, myalgias, nasal congestion and rhinorrhea. Pertinent negatives include no chills, ear congestion, ear pain, headaches, rash, sore throat, shortness of breath or wheezing. Risk factors for lung disease include smoking/tobacco exposure. He has tried nothing for the symptoms. There is no history of asthma, COPD, emphysema or environmental allergies.   No known sick contacts.   Seen at [facility] yesterday... Had labs and flu swab, but cannot get results.   Review of Systems  Constitutional: Positive for fever. Negative for chills.  HENT: Positive for rhinorrhea. Negative for ear pain and sore throat.   Respiratory: Positive for cough. Negative for shortness of breath and wheezing.   Musculoskeletal: Positive for myalgias.  Skin: Negative for rash.  Allergic/Immunologic: Negative for environmental allergies.  Neurological: Negative for headaches.       Objective:   Physical Exam  Constitutional: Vital signs are normal. He appears well-developed and well-nourished.  Non-toxic appearance. He does not appear ill. No distress.  HENT:  Head: Normocephalic and atraumatic.  Right Ear: Hearing, tympanic membrane, external ear and ear canal normal. No tenderness. No foreign bodies. Tympanic membrane is not retracted and not bulging.  Left Ear: Hearing, tympanic membrane, external ear and ear  canal normal. No tenderness. No foreign bodies. Tympanic membrane is not retracted and not bulging.  Nose: Nose normal. No mucosal edema or rhinorrhea. Right sinus exhibits no maxillary sinus tenderness and no frontal sinus tenderness. Left sinus exhibits no maxillary sinus tenderness and no frontal sinus tenderness.  Mouth/Throat: Uvula is midline, oropharynx is clear and moist and mucous membranes are normal. Normal dentition. No dental caries. No oropharyngeal exudate or tonsillar abscesses.  Eyes: Conjunctivae, EOM and lids are normal. Pupils are equal, round, and reactive to light. Lids are everted and swept, no foreign bodies found.  Neck: Trachea normal, normal range of motion and phonation normal. Neck supple. Carotid bruit is not present. No mass and no thyromegaly present.  Cardiovascular: Regular rhythm, S1 normal, S2 normal, normal heart sounds, intact distal pulses and normal pulses.  Tachycardia present.  Exam reveals no gallop.   No murmur heard. Pulmonary/Chest: Effort normal and breath sounds normal. No respiratory distress. He has no wheezes. He has no rhonchi. He has no rales.  Abdominal: Soft. Normal appearance and bowel sounds are normal. There is no hepatosplenomegaly. There is no tenderness. There is no rebound, no guarding and no CVA tenderness. No hernia.  Neurological: He is alert. He has normal reflexes.  Skin: Skin is warm, dry and intact. No rash noted.  Psychiatric: He has a normal mood and affect. His speech is normal and behavior is normal. Judgment normal.          Assessment & Plan:

## 2014-02-20 NOTE — Progress Notes (Signed)
Pre visit review using our clinic review tool, if applicable. No additional management support is needed unless otherwise documented below in the visit note. 

## 2014-02-20 NOTE — Patient Instructions (Signed)
Push fluids as mild dehydration likely cause of fast heart rate. Tylenol or ibuprofen for pain or fever. Call if not improving in next 48-72 hours as expected, or if new symptoms.

## 2014-08-06 ENCOUNTER — Telehealth: Payer: Self-pay | Admitting: Family Medicine

## 2014-08-06 ENCOUNTER — Ambulatory Visit (INDEPENDENT_AMBULATORY_CARE_PROVIDER_SITE_OTHER): Payer: 59 | Admitting: Family Medicine

## 2014-08-06 ENCOUNTER — Encounter: Payer: Self-pay | Admitting: Family Medicine

## 2014-08-06 VITALS — BP 128/84 | HR 90 | Temp 98.0°F | Wt 224.5 lb

## 2014-08-06 DIAGNOSIS — R002 Palpitations: Secondary | ICD-10-CM

## 2014-08-06 MED ORDER — METOPROLOL TARTRATE 25 MG PO TABS: 12.5000 mg | ORAL_TABLET | Freq: Two times a day (BID) | ORAL | Status: DC | PRN

## 2014-08-06 NOTE — Patient Instructions (Signed)
Go to the lab on the way out.  We'll contact you with your lab report. Take 1/2 to 1 metoprolol if you have more palpitations.  Try to cut back on caffeine and smoking.  Notify Copland if the continue.  Take care .

## 2014-08-06 NOTE — Telephone Encounter (Signed)
Pt is seeing Dr. Damita Dunnings today at 3:00pm

## 2014-08-06 NOTE — Progress Notes (Signed)
Pre visit review using our clinic review tool, if applicable. No additional management support is needed unless otherwise documented below in the visit note.  Daughter was in a MVA last week.  She was not injured, was not hospitalized.  His palpitations predate that event.  Smoking less than 1 PPD.   "I felt my heart more than usual."  Felt a faster heart rate, slightly elevated.  No CP, no SOB, no BLE edema.  No pre/syncope.  Caffeine- a few cups of coffee a day, no change.  No other stimulants.   The episodes seemed to last up to a few hours, occ less.   H/o airborne prev, ie prev with high fitness level and able to tolerate prev long runs in [employer]   PMH and SH reviewed  ROS: See HPI, otherwise noncontributory.  Meds, vitals, and allergies reviewed.   GEN: nad, alert and oriented HEENT: mucous membranes moist NECK: supple w/o LA CV: rrr.  no murmur PULM: ctab, no inc wob ABD: soft, +bs EXT: no edema SKIN: no acute rash

## 2014-08-06 NOTE — Telephone Encounter (Signed)
Patient Information:  Caller Name: Mieczyslaw  Phone: [phone]  Patient: David Collins, David Collins  Gender: Male  DOB: 17-Jun-1966  Age: 48 Years  PCP: Owens Loffler (Family Practice)  Office Follow Up:  Does the office need to follow up with this patient?: No  Instructions For The Office: N/A  RN Note:  Pt has an appt for 1500 on 08/06/14.  Symptoms  Reason For Call & Symptoms: Pt calling regarding episodes of palpitations since 07/29/14. Not having any at present or today, 08/06/14. Last episode 08/05/14 at 1800. When happens, no other sx. Only feels palpitation and rapid heart beat. No severe anxiety or stress, other than 9 yo daughter having a car accident last week.  Reviewed Health History In EMR: Yes  Reviewed Medications In EMR: Yes  Reviewed Allergies In EMR: Yes  Reviewed Surgeries / Procedures: Yes  Date of Onset of Symptoms: 07/29/2014  Guideline(s) Used:  Heart Rate and Heartbeat Questions  Disposition Per Guideline:   See Today in Office  Reason For Disposition Reached:   Skipped or extra beat(s) and occurs 4 or more times per minute  Advice Given:  Reassurance  Everybody has palpitations at some point in their lives. Sometimes it is simply a heightened awareness of the heart's normal beating.  Occasional extra heart beats are experienced by most everyone. Lack of sleep, stress, and caffeinated beverages can make palpitations worse.  Patients with anxiety or stress may describe a "rapid heartbeat" or "pounding" in their chest from their heart beating.  Avoid Caffeine  Avoid caffeine-containing beverages (Reason: caffeine is a stimulant and can aggravate palpitations).  Examples of caffeine-containing beverages include coffee, tea, colas, Mountain Dew, [employer], and some energy drinks.  Limit Smoking:   Stop or reduce your smoking.  Call Back If:  Chest pain, lightheadedness, or difficulty breathing occurs  Heart beating more than 130 beats / minute  More than 3 extra or  skipped beats / minute  You become worse.  Patient Will Follow Care Advice:  YES

## 2014-08-07 ENCOUNTER — Encounter: Payer: Self-pay | Admitting: Family Medicine

## 2014-08-07 DIAGNOSIS — R002 Palpitations: Secondary | ICD-10-CM | POA: Insufficient documentation

## 2014-08-07 LAB — BASIC METABOLIC PANEL
BUN: 17 mg/dL (ref 6–23)
CALCIUM: 9.3 mg/dL (ref 8.4–10.5)
CO2: 30 mEq/L (ref 19–32)
Chloride: 107 mEq/L (ref 96–112)
Creatinine, Ser: 1.9 mg/dL — ABNORMAL HIGH (ref 0.4–1.5)
GFR: 41.77 mL/min — AB (ref >60.00)
GLUCOSE: 82 mg/dL (ref 70–99)
Potassium: 5.1 mEq/L (ref 3.5–5.1)
SODIUM: 141 meq/L (ref 135–145)

## 2014-08-07 LAB — TSH: TSH: 0.93 u[IU]/mL (ref 0.35–4.50)

## 2014-08-07 NOTE — Assessment & Plan Note (Signed)
D/w pt.  EKG unremarkable, reviewed, d/w pt.  Would check basic labs today.  Cut back on caffeine and smoking.  Given rx for BB for PRN use. If recurrent sx, then notify PCP. He agrees.  Assuming labs wnl, I doubt ominous dx.

## 2014-08-09 ENCOUNTER — Other Ambulatory Visit: Payer: Self-pay | Admitting: Family Medicine

## 2014-08-09 DIAGNOSIS — R7989 Other specified abnormal findings of blood chemistry: Secondary | ICD-10-CM

## 2014-08-13 ENCOUNTER — Other Ambulatory Visit (INDEPENDENT_AMBULATORY_CARE_PROVIDER_SITE_OTHER): Payer: 59

## 2014-08-13 DIAGNOSIS — R799 Abnormal finding of blood chemistry, unspecified: Secondary | ICD-10-CM

## 2014-08-13 DIAGNOSIS — R7989 Other specified abnormal findings of blood chemistry: Secondary | ICD-10-CM

## 2014-08-14 LAB — BASIC METABOLIC PANEL
BUN: 19 mg/dL (ref 6–23)
CALCIUM: 9.1 mg/dL (ref 8.4–10.5)
CHLORIDE: 106 meq/L (ref 96–112)
CO2: 27 mEq/L (ref 19–32)
CREATININE: 1.6 mg/dL — AB (ref 0.4–1.5)
GFR: 51.54 mL/min — AB (ref >60.00)
Glucose, Bld: 97 mg/dL (ref 70–99)
Potassium: 3.8 mEq/L (ref 3.5–5.1)
Sodium: 140 mEq/L (ref 135–145)

## 2014-08-16 ENCOUNTER — Telehealth: Payer: Self-pay | Admitting: Family Medicine

## 2014-08-16 DIAGNOSIS — R7989 Other specified abnormal findings of blood chemistry: Secondary | ICD-10-CM

## 2014-08-16 NOTE — Telephone Encounter (Signed)
Notify pt.  I talked with Dr. Lorelei Pont.  I see two options- refer to renal now or hold off on referral/recheck labs in 1 month.  If labs are fine, no f/u.  If still abnormal at that point, refer to renal.  Avoid nsaids and drink plenty of water in the meantime.  Let me know what he wants to do so I can put in the referral or the order. Thanks.

## 2014-08-17 NOTE — Telephone Encounter (Signed)
Spoke to patient and was advised that he wants to make sure that it is okay to wait a month. Patient stated that he is okay on holding off on the renal referral and would like to have the lab work repeated in 3 weeks.

## 2014-08-17 NOTE — Telephone Encounter (Signed)
Patient notified by telephone and lab appointment scheduled.

## 2014-08-17 NOTE — Telephone Encounter (Signed)
3 weeks is okay.  Ordered. Thanks.

## 2014-08-20 ENCOUNTER — Telehealth: Payer: Self-pay | Admitting: Family Medicine

## 2014-08-20 NOTE — Telephone Encounter (Signed)
Patient is scheduled to see Dr. Glori Bickers on Friday 08/21/2014.

## 2014-08-20 NOTE — Telephone Encounter (Signed)
?   Dr Lillie Fragmin pt

## 2014-08-20 NOTE — Telephone Encounter (Signed)
Routed to PCP and assistant

## 2014-08-20 NOTE — Telephone Encounter (Signed)
Patient Information:  Caller Name: Navon  Phone: [phone]  Patient: David Collins, David Collins  Gender: Male  DOB: [birth year]  Age: 48 Years  PCP: Owens Loffler (Family Practice)  Office Follow Up:  Does the office need to follow up with this patient?: No  Instructions For The Office: N/A  RN Note:  History of elevated creatinine level. Urine color is normal; perhas some increased blubbles in urine. No urinary symptoms. Back "discomfort" is constant; rated mild 3/10.  More tingling in legs or feet then ususal. Declined appointment 08/20/14 in office per see today disposition due to distance from work.  Transferred to Robin/office scheduler for appointment for 08/21/14 at 0815 with Dr Glori Bickers.   Symptoms  Reason For Call & Symptoms: Increased mild low back pain, dizziness, and shortness of breath with exertion  Reviewed Health History In EMR: Yes  Reviewed Medications In EMR: Yes  Reviewed Allergies In EMR: Yes  Reviewed Surgeries / Procedures: Yes  Date of Onset of Symptoms: 08/19/2014  Treatments Tried: increased fluid intake  Treatments Tried Worked: No  Guideline(s) Used:  Back Pain  Disposition Per Guideline:   See Today in Office  Reason For Disposition Reached:   Tingling or numbness in the legs or feet  Advice Given:  Cold or Heat:  Cold Pack: For pain or swelling, use a cold pack or ice wrapped in a wet cloth. Put it on the sore area for 20 minutes. Repeat 4 times on the first day, then as needed.  Heat Pack: If pain lasts over 2 days, apply heat to the sore area. Use a heat pack, heating pad, or warm wet washcloth. Do this for 10 minutes, then as needed. For widespread stiffness, take a hot bath or hot shower instead. Move the sore area under the warm water.  Sleep:  Sleep on your side with a pillow between your knees. If you sleep on your back, put a pillow under your knees.  Avoid sleeping on your stomach.  Your mattress should be firm. Avoid waterbeds.  Activity  Keep  doing your day-to-day activities if it is not too painful. Staying active is better than resting.  Avoid anything that makes your pain worse. Avoid heavy lifting, twisting, and too much exercise until your back heals.  Call Back If:  Numbness or weakness occur  Bowel/bladder problems occur  Pain lasts for more than 2 weeks  You become worse.  Patient Refused Recommendation:  Patient Refused Appt, Patient Requests Appt At Later Date  Requests appointment for 08/21/14.

## 2014-08-20 NOTE — Telephone Encounter (Signed)
Had sent CAN note to Dr Glori Bickers and her CMA because pt has appt with Dr Glori Bickers on 08/21/14.

## 2014-08-21 ENCOUNTER — Ambulatory Visit (INDEPENDENT_AMBULATORY_CARE_PROVIDER_SITE_OTHER): Payer: 59 | Admitting: Family Medicine

## 2014-08-21 ENCOUNTER — Ambulatory Visit (HOSPITAL_BASED_OUTPATIENT_CLINIC_OR_DEPARTMENT_OTHER)
Admission: RE | Admit: 2014-08-21 | Discharge: 2014-08-21 | Disposition: A | Discharge status: Home / Self Care | Payer: 59 | Source: Ambulatory Visit | Attending: Family Medicine | Admitting: Family Medicine

## 2014-08-21 ENCOUNTER — Encounter: Payer: Self-pay | Admitting: Family Medicine

## 2014-08-21 VITALS — BP 110/70 | HR 90 | Temp 98.5°F | Wt 225.4 lb

## 2014-08-21 DIAGNOSIS — N289 Disorder of kidney and ureter, unspecified: Secondary | ICD-10-CM

## 2014-08-21 DIAGNOSIS — IMO0002 Reserved for concepts with insufficient information to code with codable children: Secondary | ICD-10-CM

## 2014-08-21 DIAGNOSIS — R7989 Other specified abnormal findings of blood chemistry: Secondary | ICD-10-CM | POA: Diagnosis not present

## 2014-08-21 DIAGNOSIS — M5416 Radiculopathy, lumbar region: Secondary | ICD-10-CM

## 2014-08-21 DIAGNOSIS — M5489 Other dorsalgia: Secondary | ICD-10-CM

## 2014-08-21 DIAGNOSIS — F17200 Nicotine dependence, unspecified, uncomplicated: Secondary | ICD-10-CM

## 2014-08-21 DIAGNOSIS — M549 Dorsalgia, unspecified: Secondary | ICD-10-CM

## 2014-08-21 LAB — POCT URINALYSIS DIPSTICK
Bilirubin, UA: NEGATIVE
Blood, UA: NEGATIVE
Glucose, UA: NEGATIVE
KETONES UA: NEGATIVE
LEUKOCYTES UA: NEGATIVE
NITRITE UA: NEGATIVE
PROTEIN UA: NEGATIVE
Spec Grav, UA: >=1.03
UROBILINOGEN UA: 0.2
pH, UA: 6

## 2014-08-21 LAB — POCT UA - MICROSCOPIC ONLY
Crystals, Ur, HPF, POC: 0
RBC, urine, microscopic: 0
WBC, UR, HPF, POC: 0
YEAST UA: 0

## 2014-08-21 NOTE — Telephone Encounter (Signed)
I have only met this patient once a couple of years ago. I am not sure I can add much. I would defer to my partner and appreciate her assistance.

## 2014-08-21 NOTE — Progress Notes (Signed)
Subjective:    Patient ID: David Collins, male    DOB: 03/07/1966, 48 y.o.   MRN: [id number]  HPI Had some abn labs -creatinine  Lab Results  Component Value Date   CREATININE 1.6* 08/14/2014   no hx of of renal problems No recent nsaids    Results for orders placed in visit on 08/21/14  POCT URINALYSIS DIPSTICK      Result Value Ref Range   Color, UA yellow     Clarity, UA clear     Glucose, UA negative     Bilirubin, UA negative     Ketones, UA negative     Spec Grav, UA >=1.030     Blood, UA negative     pH, UA 6.0     Protein, UA negative     Urobilinogen, UA 0.2     Nitrite, UA negative     Leukocytes, UA Negative     one white cell cast seen on micro today   Does not drink enough water  No nsaids in 4-5 mo   Lower back hurts a bit -- has had back probs in the past - lumbar radiculopathy   Tramadol- taks 1-2 most evenings but not always -has hx of chronic low back pain with radiculopathy That has been fairly stable   Has metoprolol - prn / has not needed it   occ takes a vitamin (multi)-no other otc med   Is a smoker -no plans to quit and no known lung dz  No family hx of kidney problems   He has never had diabetes    Patient Active Problem List   Diagnosis Date Noted  . Renal insufficiency 08/21/2014  . Palpitations 08/07/2014  . Abdominal pain, left lower quadrant 10/10/2013  . Lumbar radiculopathy 01/29/2013  . Cervical disc disorder with radiculopathy of cervical region 01/29/2013  . Hyperlipidemia 01/29/2013  . Hypertension    Past Medical History  Diagnosis Date  . Back pain   . Sciatic nerve pain   . Hyperlipidemia 01/29/2013  . Hypertension    No past surgical history on file. History  Substance Use Topics  . Smoking status: Current Every Day Smoker -- 0.75 packs/day    Types: Cigarettes  . Smokeless tobacco: Never Used  . Alcohol Use: Yes     Comment: occ   Family History  Problem Relation Age of Onset  . Skin cancer     parents   Allergies  Allergen Reactions  . Sulfa Antibiotics Rash   Current Outpatient Prescriptions on File Prior to Visit  Medication Sig Dispense Refill  . traMADol (ULTRAM) 50 MG tablet Take 50 mg by mouth every 6 (six) hours as needed for pain.      . metoprolol tartrate (LOPRESSOR) 25 MG tablet Take 0.5-1 tablets (12.5-25 mg total) by mouth 2 (two) times daily as needed (palpitations).  30 tablet  1   No current facility-administered medications on file prior to visit.    Review of Systems Review of Systems  Constitutional: Negative for fever, appetite change, fatigue and unexpected weight change.  Eyes: Negative for pain and visual disturbance.  Respiratory: Negative for cough and shortness of breath.   Cardiovascular: Negative for cp or palpitations    Gastrointestinal: Negative for nausea, diarrhea and constipation.  Genitourinary: Negative for urgency and frequency.  Skin: Negative for pallor or rash   MSK pos for chronic low back pain  Neurological: Negative for weakness, light-headedness, numbness and headaches.  Hematological: Negative for  adenopathy. Does not bruise/bleed easily.  Psychiatric/Behavioral: Negative for dysphoric mood. The patient is not nervous/anxious.         Objective:   Physical Exam  Constitutional: He appears well-developed and well-nourished. No distress.  HENT:  Head: Normocephalic.  Mouth/Throat: Oropharynx is clear and moist.  Eyes: Conjunctivae and EOM are normal. Pupils are equal, round, and reactive to light. No scleral icterus.  Neck: Normal range of motion. Neck supple. No JVD present. Carotid bruit is not present. No thyromegaly present.  Cardiovascular: Normal rate, regular rhythm, normal heart sounds and intact distal pulses.  Exam reveals no gallop.   Pulmonary/Chest: Breath sounds normal. No respiratory distress. He has no wheezes. He has no rales.  Abdominal: Soft. Bowel sounds are normal. He exhibits no distension and no mass.  There is no tenderness. There is no rebound and no guarding.  No cva tenderness No suprapubic tenderness or fullness    Musculoskeletal: He exhibits tenderness. He exhibits no edema.  Some lumbar spine tenderness  Lymphadenopathy:    He has no cervical adenopathy.  Neurological: He is alert.  Skin: Skin is warm and dry. No rash noted. No pallor.  Psychiatric: He has a normal mood and affect.          Assessment & Plan:   Problem List Items Addressed This Visit     Nervous and Auditory   Lumbar radiculopathy     Per pt not as bothersome as in the past  No flank pain or symptoms of kidney stone       Genitourinary   Renal insufficiency      ? Cause  Cr improved on 2nd draw  Lab Results  Component Value Date   CREATININE 1.6* 08/14/2014   No nsaids in a while Disc imp of hydration  Schedule renal US - and then return to PCP to discuss further  One wbc cast seen on UA today    Relevant Orders      US Renal (Completed)      POCT UA - Microscopic Only (Completed)    Other Visit Diagnoses   Other back pain    -  Primary    Relevant Orders       POCT urinalysis dipstick (Completed)

## 2014-08-21 NOTE — Progress Notes (Signed)
Pre-visit discussion using our clinic review tool. No additional management support is needed unless otherwise documented below in the visit note.  

## 2014-08-21 NOTE — Patient Instructions (Signed)
Drink more water - try to get 10-12 servings per day - mostly water - juices and other clear liquids are ok but do not forget the water  Do not depend on coffee or tea Careful with alcohol-it can dehydrate you   Stop up front for a referral for a renal ultrasound   Follow up with Dr Lorelei Pont about a week after ultrasound

## 2014-08-23 DIAGNOSIS — F17200 Nicotine dependence, unspecified, uncomplicated: Secondary | ICD-10-CM | POA: Insufficient documentation

## 2014-08-23 NOTE — Assessment & Plan Note (Signed)
?   Cause  Cr improved on 2nd draw  Lab Results  Component Value Date   CREATININE 1.6* 08/14/2014   No nsaids in a while Disc imp of hydration  Schedule renal US - and then return to PCP to discuss further  One wbc cast seen on UA today

## 2014-08-23 NOTE — Assessment & Plan Note (Signed)
Disc in detail risks of smoking and possible outcomes including copd, vascular/ heart disease, cancer , respiratory and sinus infections  Pt voices understanding He is not ready to quit  

## 2014-08-23 NOTE — Assessment & Plan Note (Signed)
Per pt not as bothersome as in the past  No flank pain or symptoms of kidney stone

## 2014-08-31 ENCOUNTER — Ambulatory Visit: Payer: 59 | Admitting: Family Medicine

## 2014-09-07 ENCOUNTER — Other Ambulatory Visit: Payer: 59

## 2014-09-10 ENCOUNTER — Ambulatory Visit (INDEPENDENT_AMBULATORY_CARE_PROVIDER_SITE_OTHER): Payer: 59 | Admitting: Family Medicine

## 2014-09-10 ENCOUNTER — Encounter: Payer: Self-pay | Admitting: Family Medicine

## 2014-09-10 VITALS — BP 124/90 | HR 79 | Temp 98.1°F | Ht 71.0 in | Wt 225.5 lb

## 2014-09-10 DIAGNOSIS — R2232 Localized swelling, mass and lump, left upper limb: Secondary | ICD-10-CM

## 2014-09-10 NOTE — Progress Notes (Signed)
Dr. Frederico Hamman T. Shamir Sedlar, MD, [facility] [place], [address] Phone: [phone] Fax: [phone]  09/10/2014  Patient: David Collins, MRN: [id number], DOB: 10-02-1966, 48 y.o.  Primary Physician:  David Loffler, MD  Chief Complaint: Hand Pain  Subjective:   David Collins is a 48 y.o. very pleasant male patient who presents with the following:  L 3rd dorsum of hand. About 2 weeks ago this gentleman developed a nodule along the tendon of his third digit on his left hand. He had no trauma. 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  Past Medical History, Surgical History, Social History, Family History, Problem List, Medications, and Allergies have been reviewed and updated if relevant.  GEN: No fevers, chills. Nontoxic. Primarily MSK c/o today. MSK: Detailed in the HPI GI: tolerating PO intake without difficulty Neuro: No numbness, parasthesias, or tingling associated. Otherwise the pertinent positives of the ROS are noted above.   Objective:   BP 124/90  Pulse 79  Temp(Src) 98.1 F (36.7 C) (Oral)  Ht 5\' 11"  (1.803 m)  Wt 225 lb 8 oz (102.286 kg)  BMI 31.46 kg/m2  SpO2 97%   GEN: WDWN, NAD, Non-toxic, Alert & Oriented x 3 HEENT: Atraumatic, Normocephalic.  Ears and Nose: No external deformity. EXTR: No clubbing/cyanosis/edema NEURO: Normal gait.  PSYCH: Normally interactive. Conversant. Not depressed or anxious appearing.  Calm demeanor.   L hand Ecchymosis or edema: neg ROM wrist/hand/digits: full  Carpals, MCP's, digits: NT Distal Ulna and Radius: NT Ecchymosis or edema: neg No instability Cysts/nodules: Along the third extensor tendon, the patient has a hard nodule that moves with the tendon. Digit triggering: neg Finkelstein's test: neg Snuffbox tenderness: neg Scaphoid tubercle: NT Resisted supination: NT Full composite fist, no malrotation Grip, all digits: 5/5  str DIPJT: NT PIP JT: NT MCP JT: NT No tenosynovitis Axial load test: neg Phalen's: neg Tinel's: neg Atrophy: neg  Hand sensation: intact   Radiology: US Renal  08/21/2014   CLINICAL DATA:  Renal insufficiency.  Abnormal blood work.  EXAM: RENAL/URINARY TRACT ULTRASOUND COMPLETE  COMPARISON:  None.  FINDINGS: Right Kidney:  Length: 11.2 cm. Echogenicity within normal limits. No mass or hydronephrosis visualized.  Left Kidney:  Length: 11.9 cm. Echogenicity within normal limits. No mass or hydronephrosis visualized.  Bladder:  Appears normal for degree of bladder distention.  IMPRESSION: 1. Normal renal sonogram.   Electronically Signed   By: Kerby Moors M.D.   On: 08/21/2014 10:41    Assessment and Plan:   Nodule of finger of left hand  I think more likely this is contiguous with the tendon itself or with the tendon sheath. It may be simple tenosynovitis. Recommended limitation of movement at the MCP joint for the next 2 or 3 weeks.  A tendon sheath tumor or cyst could also be in the differential diagnosis. I recommended doing minimal to this unless it starts growing, or causes numbness or tingling. In that case, consultation for consideration of operative removal would be appropriate or could be considered.  Signed,  David Deed. Shakeena Kafer, MD   Patient's Medications  New Prescriptions   No medications on file  Previous Medications   METOPROLOL TARTRATE (LOPRESSOR) 25 MG TABLET    Take 0.5-1 tablets (12.5-25 mg total) by mouth 2 (two) times daily as needed (palpitations).   TRAMADOL (ULTRAM) 50 MG TABLET    Take 50 mg by mouth every 6 (six)  hours as needed for pain.  Modified Medications   No medications on file  Discontinued Medications   No medications on file

## 2014-09-10 NOTE — Progress Notes (Signed)
Pre visit review using our clinic review tool, if applicable. No additional management support is needed unless otherwise documented below in the visit note. 

## 2014-09-28 ENCOUNTER — Ambulatory Visit: Payer: 59 | Admitting: Family Medicine

## 2014-09-28 DIAGNOSIS — Z0289 Encounter for other administrative examinations: Secondary | ICD-10-CM

## 2014-09-29 ENCOUNTER — Telehealth: Payer: Self-pay | Admitting: Family Medicine

## 2014-09-29 NOTE — Telephone Encounter (Signed)
Patient did not come for their scheduled appointment 11/2 for FU per Dr Glori Bickers after Renal US.  Please let me know if the patient needs to be contacted immediately for follow up or if no follow up is necessary.

## 2014-09-29 NOTE — Telephone Encounter (Signed)
No f/u needed acutely. Renal u/s was normal.

## 2015-10-11 ENCOUNTER — Ambulatory Visit (INDEPENDENT_AMBULATORY_CARE_PROVIDER_SITE_OTHER): Payer: 59 | Admitting: Family Medicine

## 2015-10-11 VITALS — BP 104/76 | HR 93 | Temp 98.5°F | Ht 71.0 in | Wt 224.5 lb

## 2015-10-11 DIAGNOSIS — M5440 Lumbago with sciatica, unspecified side: Secondary | ICD-10-CM

## 2015-10-11 MED ORDER — PREDNISONE 20 MG PO TABS: ORAL_TABLET | ORAL | Status: DC

## 2015-10-11 MED ORDER — TIZANIDINE HCL 4 MG PO TABS: 4.0000 mg | ORAL_TABLET | Freq: Every evening | ORAL | Status: DC

## 2015-10-11 NOTE — Progress Notes (Signed)
Pre visit review using our clinic review tool, if applicable. No additional management support is needed unless otherwise documented below in the visit note. 

## 2015-10-12 ENCOUNTER — Encounter: Payer: Self-pay | Admitting: Family Medicine

## 2015-10-12 NOTE — Progress Notes (Signed)
Dr. Frederico Hamman T. Yomayra Tate, MD, [facility] [place], [address] Phone: [phone] Fax: [phone]  10/11/2015  Patient: David Collins, MRN: [id number], DOB: 22-May-1966, 49 y.o.  Primary Physician:  Owens Loffler, MD   Chief Complaint  Patient presents with  . Back Pain    Low   Subjective:   David Collins is a 49 y.o. very pleasant male patient who presents with the following: Back Pain  ongoing for approximately: 1 week, abrupt onset and stab acutely while bending over The patient has had back pain before. The back pain is localized into the lumbar spine area. They also describe no radiculopathy, or not worse from baseline radiculopathy  No numbness or tingling. No bowel or bladder incontinence. No focal weakness. Prior interventions: tramadol, tylenol, motrin Physical therapy: No Chiropractic manipulations: No Acupuncture: No Osteopathic manipulation: No Heat or cold: Minimal effect  Past Medical History, Surgical History, Family History, Medications, Allergies have been reviewed and updated if relevant.  Patient Active Problem List   Diagnosis Date Noted  . Smoker 08/23/2014  . Renal insufficiency 08/21/2014  . Palpitations 08/07/2014  . Abdominal pain, left lower quadrant 10/10/2013  . Lumbar radiculopathy 01/29/2013  . Cervical disc disorder with radiculopathy of cervical region 01/29/2013  . Hyperlipidemia 01/29/2013  . Hypertension     Past Medical History  Diagnosis Date  . Back pain   . Sciatic nerve pain   . Hyperlipidemia 01/29/2013  . Hypertension     No past surgical history on file.  Social History   Social History  . Marital Status: Divorced    Spouse Name: N/A  . Number of Children: N/A  . Years of Education: N/A   Occupational History  .  Land   Social History Main Topics  . Smoking status: Former Smoker -- 0.75 packs/day    Types:  Cigarettes    Quit date: 09/03/2014  . Smokeless tobacco: Never Used  . Alcohol Use: Yes     Comment: occ  . Drug Use: No  . Sexual Activity: Not on file   Other Topics Concern  . 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 Skin cancer      parents    Allergies  Allergen Reactions  . Sulfa Antibiotics Rash    Medication list reviewed and updated in full in [facility].  GEN: No fevers, chills. Nontoxic. Primarily MSK c/o today. MSK: Detailed in the HPI GI: tolerating PO intake without difficulty Neuro: As above  Otherwise the pertinent positives of the ROS are noted above.    Objective:   Blood pressure 104/76, pulse 93, temperature 98.5 F (36.9 C), temperature source Oral, height 5\' 11"  (1.803 m), weight 224 lb 8 oz (101.833 kg).  Gen: Well-developed,well-nourished,in no acute distress; alert,appropriate and cooperative throughout examination HEENT: Normocephalic and atraumatic without obvious abnormalities.  Ears, externally no deformities Pulm: Breathing comfortably in no respiratory distress Range of motion at  the waist: Flexion, rotation and lateral bending: full  No echymosis or edema Rises to examination table with no difficulty Gait: minimally antalgic  Inspection/Deformity: No abnormality Paraspinus T:  Mild TTP - diffusely  B Ankle Dorsiflexion (L5,4): 5/5 B Great Toe Dorsiflexion (L5,4): 5/5 Heel Walk (L5): WNL Toe Walk (S1): WNL Rise/Squat (L4): WNL, mild  pain  SENSORY B Medial Foot (L4): WNL B Dorsum (L5): WNL B Lateral (S1): WNL Light Touch: WNL Pinprick: WNL  REFLEXES Knee (L4): 2+ Ankle (S1): 2+  B SLR, seated: neg B SLR, supine: neg B FABER: neg B Reverse FABER: neg B Greater Troch: NT B Log Roll: neg B Stork: NT B Sciatic Notch: NT  Radiology: No results found.  Assessment and Plan:   Low back pain with  sciatica, sciatica laterality unspecified, unspecified back pain laterality  ? Disc herniation more likely with known DDD, baseline sciatica, likely worsening of problem  Anatomy reviewed. Conservative algorithms for acute back pain generally begin with the following: NSAIDS, Muscle Relaxants, Mild pain medication - pred in this case  Start with medications, core rehab, and progress from there following low back pain algorithm. No red flags are present.  A rehabilitation program from the [employer] was reviewed with the patient face to face for their condition.   Follow-up: No Follow-up on file.  New Prescriptions   PREDNISONE (DELTASONE) 20 MG TABLET    2 tabs po for 5 days, then 1 tab po for 5 days   TIZANIDINE (ZANAFLEX) 4 MG TABLET    Take 1 tablet (4 mg total) by mouth Nightly.   Signed,  Maud Deed. Elantra Caprara, MD   Patient's Medications  New Prescriptions   PREDNISONE (DELTASONE) 20 MG TABLET    2 tabs po for 5 days, then 1 tab po for 5 days   TIZANIDINE (ZANAFLEX) 4 MG TABLET    Take 1 tablet (4 mg total) by mouth Nightly.  Previous Medications   METOPROLOL TARTRATE (LOPRESSOR) 25 MG TABLET    Take 0.5-1 tablets (12.5-25 mg total) by mouth 2 (two) times daily as needed (palpitations).   TRAMADOL (ULTRAM) 50 MG TABLET    Take 50 mg by mouth every 6 (six) hours as needed for pain.  Modified Medications   No medications on file  Discontinued Medications   No medications on file

## 2015-11-28 HISTORY — PX: ANTERIOR CERVICAL DISCECTOMY: SHX1160

## 2016-01-16 ENCOUNTER — Other Ambulatory Visit: Payer: Self-pay | Admitting: Family Medicine

## 2016-01-16 NOTE — Telephone Encounter (Signed)
Last office visit 10/11/2015.  Last refilled 10/11/2015 for #30 with 2 refills.  Ok to refill?

## 2016-02-18 ENCOUNTER — Ambulatory Visit (INDEPENDENT_AMBULATORY_CARE_PROVIDER_SITE_OTHER): Payer: 59 | Admitting: Medical

## 2016-02-18 ENCOUNTER — Encounter: Payer: Self-pay | Admitting: Medical

## 2016-02-18 VITALS — BP 114/74 | HR 90 | Temp 98.2°F | Ht 71.0 in | Wt 229.0 lb

## 2016-02-18 DIAGNOSIS — H9202 Otalgia, left ear: Secondary | ICD-10-CM

## 2016-02-18 MED ORDER — NEOMYCIN-POLYMYXIN-HC 3.5-10000-1 OT SOLN: 3.0000 [drp] | Freq: Four times a day (QID) | OTIC | Status: DC

## 2016-02-18 NOTE — Patient Instructions (Addendum)
For your recurrent ear pain/discomfort  will give cortisporin otic drops. Follow up in 10 days or as needed. See if area of irritation resolved. If not then will refer to ENT. Follow up important.

## 2016-02-18 NOTE — Progress Notes (Signed)
Subjective:    Patient ID: David Collins, male    DOB: [birth year], 50 y.o.   MRN: [id number]  HPI  Pt in with some ear pain. Patient states some pain on and off past 3-4 months. He wonders if reoccuring sore. Pt states has gotten sore maybe 8 times. Last 2-3 days and then resolves. About one week ago. He felt like he had scab that was felt.Still faint sore.   Review of Systems  Constitutional: Negative for chills and fatigue.  HENT: Positive for ear pain.        Slight discomfort.  Cardiovascular: Negative for chest pain and palpitations.  Gastrointestinal: Negative for abdominal pain.  Neurological: Negative for dizziness and headaches.  Hematological: Negative for adenopathy. Does not bruise/bleed easily.  Psychiatric/Behavioral: Negative for behavioral problems and confusion.    Past Medical History  Diagnosis Date  . Back pain   . Sciatic nerve pain   . Hyperlipidemia 01/29/2013  . Hypertension     Social History   Social History  . Marital Status: Divorced    Spouse Name: N/A  . Number of Children: N/A  . Years of Education: N/A   Occupational History  .  Land   Social History Main Topics  . Smoking status: Former Smoker -- 0.75 packs/day    Types: Cigarettes    Quit date: 09/03/2014  . Smokeless tobacco: Never Used  . Alcohol Use: Yes     Comment: occ  . Drug Use: No  . Sexual Activity: Not on file   Other Topics Concern  . 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  Family History  Problem Relation Age of Onset  . Skin cancer      parents    Allergies  Allergen Reactions  . Sulfa Antibiotics Rash    Current Outpatient Prescriptions on File Prior to Visit  Medication Sig Dispense Refill  . metoprolol tartrate (LOPRESSOR) 25 MG tablet Take 0.5-1 tablets (12.5-25 mg total) by mouth 2 (two) times daily as needed  (palpitations). 30 tablet 1  . predniSONE (DELTASONE) 20 MG tablet 2 tabs po for 5 days, then 1 tab po for 5 days 15 tablet 0  . tiZANidine (ZANAFLEX) 4 MG tablet TAKE 1 TABLET (4 MG TOTAL) BY MOUTH NIGHTLY. 30 tablet 2  . traMADol (ULTRAM) 50 MG tablet Take 50 mg by mouth every 6 (six) hours as needed for pain.     No current facility-administered medications on file prior to visit.    BP 114/74 mmHg  Pulse 90  Temp(Src) 98.2 F (36.8 C) (Oral)  Ht 5\' 11"  (1.803 m)  Wt 229 lb (103.874 kg)  BMI 31.95 kg/m2  SpO2 97%       Objective:   Physical Exam   General  Mental Status - Alert. General Appearance - Well groomed. Not in acute distress.  Skin Rashes- No Rashes.  HEENT Head- Normal. Ear Auditory Canal -  Right tm  - Normal.Tympanic Membrane- Left normal. But canal lt side- entry of canal upper area small raised area tissue, scab or wax. Tried to remove with cotton swab. Did not come off and pt very tender then tried to currette and still would not remove. Area tender to touch.skin of canal in that area looks raised.But only see side view.. Right- Normal. Eye Sclera/Conjunctiva- Left- Normal. Right- Normal. Nose &  Sinuses Nasal Mucosa- Left-  Boggy and Congested. Right-  Boggy and  Congested.Bilateral maxillary and frontal sinus pressure.  Neck Neck- Supple. No Masses.   Chest and Lung Exam Auscultation: Breath Sounds:-Clear even and unlabored.  Cardiovascular Auscultation:Rythm- Regular, rate and rhythm. Murmurs & Other Heart Sounds:Ausculatation of the heart reveal- No Murmurs.  Lymphatic Head & Neck General Head & Neck Lymphatics: Bilateral: Description- No Localized lymphadenopathy.      Assessment & Plan:  For your recurrent ear pain/discomfort  will give cortisporin otic drops. Follow up in 10 days or as needed. See if area of irritation resolved. If not then will refer to ENT. Follow up important  Counseled on why I wanted follow up and in end he may  need to even have area biopsied. Pt did not want me to refer to specialist immediatley.

## 2016-02-18 NOTE — Progress Notes (Signed)
Pre visit review using our clinic review tool, if applicable. No additional management support is needed unless otherwise documented below in the visit note. 

## 2016-04-12 ENCOUNTER — Telehealth: Payer: Self-pay | Admitting: *Deleted

## 2016-04-12 ENCOUNTER — Encounter: Payer: Self-pay | Admitting: Family Medicine

## 2016-04-12 ENCOUNTER — Ambulatory Visit (INDEPENDENT_AMBULATORY_CARE_PROVIDER_SITE_OTHER): Payer: 59 | Admitting: Family Medicine

## 2016-04-12 VITALS — BP 100/80 | HR 108 | Temp 98.6°F | Ht 71.0 in | Wt 225.0 lb

## 2016-04-12 DIAGNOSIS — R1084 Generalized abdominal pain: Secondary | ICD-10-CM | POA: Diagnosis not present

## 2016-04-12 DIAGNOSIS — N289 Disorder of kidney and ureter, unspecified: Secondary | ICD-10-CM

## 2016-04-12 DIAGNOSIS — R1032 Left lower quadrant pain: Secondary | ICD-10-CM | POA: Diagnosis not present

## 2016-04-12 DIAGNOSIS — K5792 Diverticulitis of intestine, part unspecified, without perforation or abscess without bleeding: Secondary | ICD-10-CM | POA: Diagnosis not present

## 2016-04-12 LAB — BASIC METABOLIC PANEL
BUN: 17 mg/dL (ref 6–23)
CALCIUM: 10 mg/dL (ref 8.4–10.5)
CO2: 28 mEq/L (ref 19–32)
Chloride: 102 mEq/L (ref 96–112)
Creatinine, Ser: 1.4 mg/dL (ref 0.40–1.50)
GFR: 57.55 mL/min — AB (ref >60.00)
Glucose, Bld: 96 mg/dL (ref 70–99)
Potassium: 4.8 mEq/L (ref 3.5–5.1)
SODIUM: 137 meq/L (ref 135–145)

## 2016-04-12 LAB — HEPATIC FUNCTION PANEL
ALBUMIN: 4.5 g/dL (ref 3.5–5.2)
ALK PHOS: 60 U/L (ref 39–117)
ALT: 18 U/L (ref 0–53)
AST: 16 U/L (ref 0–37)
BILIRUBIN DIRECT: 0.1 mg/dL (ref 0.0–0.3)
Total Bilirubin: 0.9 mg/dL (ref 0.2–1.2)
Total Protein: 7.6 g/dL (ref 6.0–8.3)

## 2016-04-12 LAB — LIPASE: Lipase: 17 U/L (ref 11.0–59.0)

## 2016-04-12 LAB — CBC WITH DIFFERENTIAL/PLATELET
Basophils Absolute: 0 10*3/uL (ref 0.0–0.1)
Basophils Relative: 0.2 % (ref 0.0–3.0)
EOS PCT: 1.3 % (ref 0.0–5.0)
Eosinophils Absolute: 0.2 10*3/uL (ref 0.0–0.7)
HEMATOCRIT: 46.4 % (ref 39.0–52.0)
Hemoglobin: 15.4 g/dL (ref 13.0–17.0)
LYMPHS ABS: 2.5 10*3/uL (ref 0.7–4.0)
LYMPHS PCT: 16.2 % (ref 12.0–46.0)
MCHC: 33.2 g/dL (ref 30.0–36.0)
MCV: 89.8 fl (ref 78.0–100.0)
MONOS PCT: 10.9 % (ref 3.0–12.0)
Monocytes Absolute: 1.7 10*3/uL — ABNORMAL HIGH (ref 0.1–1.0)
NEUTROS ABS: 11.2 10*3/uL — AB (ref 1.4–7.7)
NEUTROS PCT: 71.4 % (ref 43.0–77.0)
Platelets: 294 10*3/uL (ref 150.0–400.0)
RBC: 5.17 Mil/uL (ref 4.22–5.81)
RDW: 14 % (ref 11.5–15.5)
WBC: 15.6 10*3/uL — ABNORMAL HIGH (ref 4.0–10.5)

## 2016-04-12 MED ORDER — AMOXICILLIN-POT CLAVULANATE 875-125 MG PO TABS: 1.0000 | ORAL_TABLET | Freq: Two times a day (BID) | ORAL | Status: DC

## 2016-04-12 MED ORDER — METRONIDAZOLE 500 MG PO TABS: 500.0000 mg | ORAL_TABLET | Freq: Three times a day (TID) | ORAL | Status: DC

## 2016-04-12 MED ORDER — CIPROFLOXACIN HCL 750 MG PO TABS: 750.0000 mg | ORAL_TABLET | Freq: Two times a day (BID) | ORAL | Status: DC

## 2016-04-12 NOTE — Telephone Encounter (Signed)
Vicente Males, Pharmacist at [facility],  notified as instructed by telephone.

## 2016-04-12 NOTE — Progress Notes (Signed)
Pre visit review using our clinic review tool, if applicable. No additional management support is needed unless otherwise documented below in the visit note. 

## 2016-04-12 NOTE — Telephone Encounter (Signed)
D/c both cipro and flagyl  augmentin 875 mg, 1 po bid, #20, 0 ref

## 2016-04-12 NOTE — Patient Instructions (Signed)
Diverticulitis °Diverticulitis is inflammation or infection of small pouches in your colon that form when you have a condition called diverticulosis. The pouches in your colon are called diverticula. Your colon, or large intestine, is where water is absorbed and stool is formed. °Complications of diverticulitis can include: °· Bleeding. °· Severe infection. °· Severe pain. °· Perforation of your colon. °· Obstruction of your colon. °CAUSES  °Diverticulitis is caused by bacteria. °Diverticulitis happens when stool becomes trapped in diverticula. This allows bacteria to grow in the diverticula, which can lead to inflammation and infection. °RISK FACTORS °People with diverticulosis are at risk for diverticulitis. Eating a diet that does not include enough fiber from fruits and vegetables may make diverticulitis more likely to develop. °SYMPTOMS  °Symptoms of diverticulitis may include: °· Abdominal pain and tenderness. The pain is normally located on the left side of the abdomen, but may occur in other areas. °· Fever and chills. °· Bloating. °· Cramping. °· Nausea. °· Vomiting. °· Constipation. °· Diarrhea. °· Blood in your stool. °DIAGNOSIS  °Your health care provider will ask you about your medical history and do a physical exam. You may need to have tests done because many medical conditions can cause the same symptoms as diverticulitis. Tests may include: °· Blood tests. °· Urine tests. °· Imaging tests of the abdomen, including X-rays and CT scans. °When your condition is under control, your health care provider may recommend that you have a colonoscopy. A colonoscopy can show how severe your diverticula are and whether something else is causing your symptoms. °TREATMENT  °Most cases of diverticulitis are mild and can be treated at home. Treatment may include: °· Taking over-the-counter pain medicines. °· Following a clear liquid diet. °· Taking antibiotic medicines by mouth for 7-10 days. °More severe cases may  be treated at a hospital. Treatment may include: °· Not eating or drinking. °· Taking prescription pain medicine. °· Receiving antibiotic medicines through an IV tube. °· Receiving fluids and nutrition through an IV tube. °· Surgery. °HOME CARE INSTRUCTIONS  °· Follow your health care provider's instructions carefully. °· Follow a full liquid diet or other diet as directed by your health care provider. After your symptoms improve, your health care provider may tell you to change your diet. He or she may recommend you eat a high-fiber diet. Fruits and vegetables are good sources of fiber. Fiber makes it easier to pass stool. °· Take fiber supplements or probiotics as directed by your health care provider. °· Only take medicines as directed by your health care provider. °· Keep all your follow-up appointments. °SEEK MEDICAL CARE IF:  °· Your pain does not improve. °· You have a hard time eating food. °· Your bowel movements do not return to normal. °SEEK IMMEDIATE MEDICAL CARE IF:  °· Your pain becomes worse. °· Your symptoms do not get better. °· Your symptoms suddenly get worse. °· You have a fever. °· You have repeated vomiting. °· You have bloody or black, tarry stools. °MAKE SURE YOU:  °· Understand these instructions. °· Will watch your condition. °· Will get help right away if you are not doing well or get worse. °  °This information is not intended to replace advice given to you by your health care provider. Make sure you discuss any questions you have with your health care provider. °  °Document Released: 08/23/2005 Document Revised: 11/18/2013 Document Reviewed: 10/08/2013 °Elsevier Interactive Patient Education ©2016 Elsevier Inc. ° °

## 2016-04-12 NOTE — Telephone Encounter (Signed)
Anna Pharmacist with [facility] left message at Triage. She said there is an drug interaction between the cipro you just prescribed and the tizanidine pt is taking. Pharmacist said based on pt's fill history he is taking the tizanidine pretty regularly. Pharmacist said that the cipro could increase his levels of tizanidine in his system and cause heavy sedation and low BP, pharmacist wants to know if you still want them to fill his Cipro or do you want to change med to something else

## 2016-04-12 NOTE — Progress Notes (Signed)
Dr. Frederico Hamman T. Mace Weinberg, MD, [facility] [place], [address] Phone: [phone] Fax: [phone]  04/12/2016  Patient: David Collins, MRN: [id number], DOB: [birth year], 50 y.o.  Primary Physician:  Owens Loffler, MD   Chief Complaint  Patient presents with  . Abdominal Pain    LLQ   Subjective:   David Collins is a 50 y.o. very pleasant male patient who presents with the following:  LLQ and crampy hypogastric pain. No fever.  No prior abdominal surgery.  Has happened occaisionally.  2 years ago he saw my partner who diagnosed him with presumed diverticulitis and treated him with a round of Cipro and Flagyl, and he recovered from this without difficulty.  He most recently has had some renal insufficiency on his lab work additionally.  Past Medical History, Surgical History, Social History, Family History, Problem List, Medications, and Allergies have been reviewed and updated if relevant.  Patient Active Problem List   Diagnosis Date Noted  . Smoker 08/23/2014  . Renal insufficiency 08/21/2014  . Palpitations 08/07/2014  . Abdominal pain, left lower quadrant 10/10/2013  . Lumbar radiculopathy 01/29/2013  . Cervical disc disorder with radiculopathy of cervical region 01/29/2013  . Hyperlipidemia 01/29/2013  . Hypertension     Past Medical History  Diagnosis Date  . Back pain   . Sciatic nerve pain   . Hyperlipidemia 01/29/2013  . Hypertension     No past surgical history on file.  Social History   Social History  . Marital Status: Divorced    Spouse Name: N/A  . Number of Children: N/A  . Years of Education: N/A   Occupational History  .  Land   Social History Main Topics  . Smoking status: Former Smoker -- 0.75 packs/day    Types: Cigarettes    Quit date: 09/03/2014  . Smokeless tobacco: Current User     Comment: E-Cig  . Alcohol Use: 0.0 oz/week    0  Standard drinks or equivalent per week     Comment: occ  . Drug Use: No  . Sexual Activity: Not on file   Other Topics Concern  . 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 Skin cancer      parents    Allergies  Allergen Reactions  . Sulfa Antibiotics Rash    Medication list reviewed and updated in full in [facility].  ROS: GEN: Acute illness details above GI: Tolerating PO intake GU: maintaining adequate hydration and urination Pulm: No SOB Interactive and getting along well at home.  Otherwise, ROS is as per the HPI.  Objective:   BP 100/80 mmHg  Pulse 108  Temp(Src) 98.6 F (37 C) (Oral)  Ht 5\' 11"  (1.803 m)  Wt 225 lb (102.059 kg)  BMI 31.39 kg/m2  GEN: WDWN, NAD, Non-toxic, A & O x 3 HEENT: Atraumatic, Normocephalic. Neck supple. No masses, No LAD. Ears and Nose: No external deformity. CV: RRR, No M/G/R. No JVD. No thrill. No extra heart sounds. PULM: CTA B, no wheezes, crackles, rhonchi. No retractions. No resp. distress. No accessory muscle use. ABD: S, TTP LLQ, ND, +BS. No rebound. No HSM. EXTR: No c/c/e NEURO Normal gait.  PSYCH: Normally interactive. Conversant. Not depressed or anxious appearing.  Calm demeanor.  Laboratory and Imaging Data: Results for orders placed or performed in visit on [id number]  Basic metabolic panel  Result Value Ref Range   Sodium 137 135 - 145 mEq/L   Potassium 4.8 3.5 - 5.1 mEq/L   Chloride 102 96 - 112 mEq/L   CO2 28 19 - 32 mEq/L   Glucose, Bld 96 70 - 99 mg/dL   BUN 17 6 - 23 mg/dL   Creatinine, Ser 1.40 0.40 - 1.50 mg/dL   Calcium 10.0 8.4 - 10.5 mg/dL   GFR 57.55 (L) >60.00 mL/min  Hepatic function panel  Result Value Ref Range   Total Bilirubin 0.9 0.2 - 1.2 mg/dL   Bilirubin, Direct 0.1 0.0 - 0.3 mg/dL   Alkaline Phosphatase 60 39 - 117 U/L   AST 16 0 - 37 U/L   ALT 18 0 - 53 U/L   Total  Protein 7.6 6.0 - 8.3 g/dL   Albumin 4.5 3.5 - 5.2 g/dL  CBC with Differential/Platelet  Result Value Ref Range   WBC 15.6 (H) 4.0 - 10.5 K/uL   RBC 5.17 4.22 - 5.81 Mil/uL   Hemoglobin 15.4 13.0 - 17.0 g/dL   HCT 46.4 39.0 - 52.0 %   MCV 89.8 78.0 - 100.0 fl   MCHC 33.2 30.0 - 36.0 g/dL   RDW 14.0 11.5 - 15.5 %   Platelets 294.0 150.0 - 400.0 K/uL   Neutrophils Relative % 71.4 43.0 - 77.0 %   Lymphocytes Relative 16.2 12.0 - 46.0 %   Monocytes Relative 10.9 3.0 - 12.0 %   Eosinophils Relative 1.3 0.0 - 5.0 %   Basophils Relative 0.2 0.0 - 3.0 %   Neutro Abs 11.2 (H) 1.4 - 7.7 K/uL   Lymphs Abs 2.5 0.7 - 4.0 K/uL   Monocytes Absolute 1.7 (H) 0.1 - 1.0 K/uL   Eosinophils Absolute 0.2 0.0 - 0.7 K/uL   Basophils Absolute 0.0 0.0 - 0.1 K/uL  Lipase  Result Value Ref Range   Lipase 17.0 11.0 - 59.0 U/L     Assessment and Plan:   Acute diverticulitis - Plan: Basic metabolic panel, Hepatic function panel, CBC with Differential/Platelet  Renal insufficiency - Plan: Basic metabolic panel  Abdominal pain, generalized - Plan: Lipase  Abdominal pain, left lower quadrant - Plan: Lipase  Examination and laboratory work most consistent with acute diverticulitis.  Treat as such with Augmentin.  Renal insufficiency has significantly improved compared to last evaluation.  Follow-up: No Follow-up on file.  New Prescriptions   AMOXICILLIN-CLAVULANATE (AUGMENTIN) 875-125 MG TABLET    Take 1 tablet by mouth 2 (two) times daily.   Orders Placed This Encounter  Procedures  . Basic metabolic panel  . Hepatic function panel  . CBC with Differential/Platelet  . Lipase    Signed,  Revan Gendron T. Rashonda Warrior, MD   Patient's Medications  New Prescriptions   AMOXICILLIN-CLAVULANATE (AUGMENTIN) 875-125 MG TABLET    Take 1 tablet by mouth 2 (two) times daily.  Previous Medications   TIZANIDINE (ZANAFLEX) 4 MG TABLET    TAKE 1 TABLET (4 MG TOTAL) BY MOUTH NIGHTLY.   TRAMADOL (ULTRAM) 50 MG  TABLET    Take 50 mg by mouth every 6 (six) hours as needed for pain.  Modified Medications   No medications on file  Discontinued Medications   CIPROFLOXACIN (CIPRO) 750 MG TABLET    Take 1 tablet (750 mg total) by mouth 2 (two) times daily.   METOPROLOL TARTRATE (LOPRESSOR) 25 MG TABLET  Take 0.5-1 tablets (12.5-25 mg total) by mouth 2 (two) times daily as needed (palpitations).   METRONIDAZOLE (FLAGYL) 500 MG TABLET    Take 1 tablet (500 mg total) by mouth 3 (three) times daily.   NEOMYCIN-POLYMYXIN-HYDROCORTISONE (CORTISPORIN) OTIC SOLUTION    Place 3 drops into the left ear 4 (four) times daily.   PREDNISONE (DELTASONE) 20 MG TABLET    2 tabs po for 5 days, then 1 tab po for 5 days

## 2016-04-14 ENCOUNTER — Telehealth: Payer: Self-pay | Admitting: Family Medicine

## 2016-04-14 NOTE — Telephone Encounter (Signed)
Pt returned call - he wants to speak abut labs  Please call [phone]

## 2016-04-14 NOTE — Telephone Encounter (Signed)
Lab results dicussed with Mr. Theresa Mulligan.  See result note from labs done on 04/12/2016.

## 2016-04-25 ENCOUNTER — Other Ambulatory Visit: Payer: Self-pay | Admitting: Family Medicine

## 2016-04-25 NOTE — Telephone Encounter (Signed)
Last office visit 04/12/2016.  Last refilled 01/17/2016 for #30 with 2 refills.  Ok to refill?

## 2016-05-17 DIAGNOSIS — G5622 Lesion of ulnar nerve, left upper limb: Secondary | ICD-10-CM | POA: Insufficient documentation

## 2016-05-17 DIAGNOSIS — G5603 Carpal tunnel syndrome, bilateral upper limbs: Secondary | ICD-10-CM | POA: Insufficient documentation

## 2016-08-15 DIAGNOSIS — Z981 Arthrodesis status: Secondary | ICD-10-CM | POA: Insufficient documentation

## 2016-11-08 ENCOUNTER — Encounter: Payer: Self-pay | Admitting: Family Medicine

## 2016-11-08 ENCOUNTER — Ambulatory Visit (INDEPENDENT_AMBULATORY_CARE_PROVIDER_SITE_OTHER): Payer: 59 | Admitting: Family Medicine

## 2016-11-08 VITALS — BP 128/90 | HR 92 | Temp 98.4°F | Ht 71.0 in | Wt 223.5 lb

## 2016-11-08 DIAGNOSIS — E785 Hyperlipidemia, unspecified: Secondary | ICD-10-CM

## 2016-11-08 DIAGNOSIS — R7303 Prediabetes: Secondary | ICD-10-CM | POA: Diagnosis not present

## 2016-11-08 NOTE — Progress Notes (Signed)
Pre visit review using our clinic review tool, if applicable. No additional management support is needed unless otherwise documented below in the visit note. 

## 2016-11-08 NOTE — Progress Notes (Signed)
Dr. Frederico Hamman T. Madsen Riddle, MD, [facility] [place], [address] Phone: [phone] Fax: [phone]  11/08/2016  Patient: David Collins, MRN: [id number], DOB: 1966-02-20, 50 y.o.  Primary Physician:  Owens Loffler, MD   Chief Complaint  Patient presents with  . Results    Discuss lab results done at work   Subjective:   Jamie Balliett is a 50 y.o. very pleasant male patient who presents with the following:  F/u lab tests.   Reviewed in office  borderine lipids, but HDL 50.  BS 102, but all priors are normal.  Body mass index is 31.17 kg/m.   Wt Readings from Last 3 Encounters:  11/08/16 223 lb 8 oz (101.4 kg)  04/12/16 225 lb (102.1 kg)  02/18/16 229 lb (103.9 kg)    BP Readings from Last 3 Encounters:  11/08/16 128/90  04/12/16 100/80  02/18/16 114/74      Past Medical History, Surgical History, Social History, Family History, Problem List, Medications, and Allergies have been reviewed and updated if relevant.  Patient Active Problem List   Diagnosis Date Noted  . Smoker 08/23/2014  . Renal insufficiency 08/21/2014  . Palpitations 08/07/2014  . Abdominal pain, left lower quadrant 10/10/2013  . Lumbar radiculopathy 01/29/2013  . Cervical disc disorder with radiculopathy of cervical region 01/29/2013  . Hyperlipidemia 01/29/2013  . Hypertension     Past Medical History:  Diagnosis Date  . Back pain   . Hyperlipidemia 01/29/2013  . Hypertension   . Sciatic nerve pain     No past surgical history on file.  Social History   Social History  . Marital status: Divorced    Spouse name: N/A  . Number of children: N/A  . Years of education: N/A   Occupational History  .  Land   Social History Main Topics  . Smoking status: Former Smoker    Packs/day: 0.75    Types: Cigarettes    Quit date: 09/03/2014  . Smokeless tobacco: Current User     Comment: E-Cig   . Alcohol use 0.0 oz/week     Comment: occ  . Drug use: No  . Sexual activity: Not on file   Other Topics Concern  . 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 Skin cancer      parents    Allergies  Allergen Reactions  . Sulfa Antibiotics Rash    Medication list reviewed and updated in full in [facility].   GEN: No acute illnesses, no fevers, chills. GI: No n/v/d, eating normally Pulm: No SOB Interactive and getting along well at home.  Otherwise, ROS is as per the HPI.  Objective:   BP 128/90   Pulse 92   Temp 98.4 F (36.9 C) (Oral)   Ht 5\' 11"  (1.803 m)   Wt 223 lb 8 oz (101.4 kg)   BMI 31.17 kg/m   GEN: WDWN, NAD, Non-toxic, A & O x 3 HEENT: Atraumatic, Normocephalic. Neck supple. No masses, No LAD. Ears and Nose: No external deformity. CV: RRR, No M/G/R. No JVD. No thrill. No extra heart sounds. PULM: CTA B, no wheezes, crackles, rhonchi. No retractions. No resp. distress. No accessory muscle use. EXTR: No c/c/e NEURO Normal gait.  PSYCH: Normally interactive. Conversant. Not depressed or  anxious appearing.  Calm demeanor.   Laboratory and Imaging Data:  Assessment and Plan:   Hyperlipidemia LDL goal <100  Prediabetes  Work on diet and weight loss, exercise as able with neck  Follow-up: No Follow-up on file.   Patient Instructions  Insomnia:  Melatonin up to 10 mg can be taken 1 hour before sleep very safely every day  Antihistamines: 2 tabs of Benadryl is OK Or can also take 2 Dramamine (get the older version that can cause drowsiness) Or Unisom (doxylamine)     Meds ordered this encounter  Medications  . methocarbamol (ROBAXIN) 750 MG tablet    Sig: Take 750 mg by mouth at bedtime as needed.    Medications Discontinued During This Encounter  Medication Reason  . amoxicillin-clavulanate (AUGMENTIN) 875-125 MG tablet  Completed Course  . tiZANidine (ZANAFLEX) 4 MG tablet Change in therapy   No orders of the defined types were placed in this encounter.   Signed,  Maud Deed. Ziaire Hagos, MD     Medication List       Accurate as of 11/08/16  2:29 PM. Always use your most recent med list.          methocarbamol 750 MG tablet Commonly known as:  ROBAXIN Take 750 mg by mouth at bedtime as needed.   traMADol 50 MG tablet Commonly known as:  ULTRAM Take 50 mg by mouth every 6 (six) hours as needed for pain.

## 2016-11-08 NOTE — Patient Instructions (Signed)
Insomnia:  Melatonin up to 10 mg can be taken 1 hour before sleep very safely every day  Antihistamines: 2 tabs of Benadryl is OK Or can also take 2 Dramamine (get the older version that can cause drowsiness) Or Unisom (doxylamine)

## 2016-12-08 ENCOUNTER — Ambulatory Visit (INDEPENDENT_AMBULATORY_CARE_PROVIDER_SITE_OTHER): Payer: 59 | Admitting: Family Medicine

## 2016-12-08 ENCOUNTER — Encounter: Payer: Self-pay | Admitting: Family Medicine

## 2016-12-08 VITALS — BP 110/80 | HR 82 | Temp 98.2°F | Wt 227.0 lb

## 2016-12-08 DIAGNOSIS — R1032 Left lower quadrant pain: Secondary | ICD-10-CM

## 2016-12-08 MED ORDER — AMOXICILLIN-POT CLAVULANATE 875-125 MG PO TABS: 1.0000 | ORAL_TABLET | Freq: Two times a day (BID) | ORAL | 0 refills | Status: DC

## 2016-12-08 NOTE — Progress Notes (Signed)
   Subjective:    Patient ID: David Collins, male    DOB: [birth year], 51 y.o.   MRN: [id number]  HPI This is a 51 yo male who presents today with left lower quadrant pain x several days. Has started to subside today. Increased volume of stool, no looseness or diarrhea. No fever. No nausea, no vomiting. No blood or mucus in bowel movements. No change in appetite. Was treated for presumed diverticulitis 5/17 with cipro/flagyl. Has had episodes several times a year for last several years. Has never had imaging or colonoscopy.   Past Medical History:  Diagnosis Date  . Back pain   . Hyperlipidemia 01/29/2013  . Hypertension   . Sciatic nerve pain    No past surgical history on file. Family History  Problem Relation Age of Onset  . Skin cancer      parents   Social History  Substance Use Topics  . Smoking status: Former Smoker    Packs/day: 0.75    Types: Cigarettes    Quit date: 09/03/2014  . Smokeless tobacco: Current User     Comment: E-Cig  . Alcohol use 0.0 oz/week     Comment: occ      Review of Systems Per HPI    Objective:   Physical Exam  Constitutional: He is oriented to person, place, and time. He appears well-developed and well-nourished. No distress.  HENT:  Head: Normocephalic and atraumatic.  Eyes: Conjunctivae are normal.  Cardiovascular: Normal rate and regular rhythm.   Pulmonary/Chest: Effort normal and breath sounds normal.  Abdominal: Soft. Bowel sounds are normal. He exhibits no distension and no mass. There is tenderness (very mild with deep palpation of LLQ). There is no rebound and no guarding.  Neurological: He is alert and oriented to person, place, and time.  Skin: Skin is warm and dry. He is not diaphoretic.  Psychiatric: He has a normal mood and affect. Judgment and thought content normal.      BP 110/80   Pulse 82   Temp 98.2 F (36.8 C)   Wt 227 lb (103 kg)   SpO2 97%   BMI 31.66 kg/m  Wt Readings from Last 3 Encounters:  12/08/16  227 lb (103 kg)  11/08/16 223 lb 8 oz (101.4 kg)  04/12/16 225 lb (102.1 kg)       Assessment & Plan:  1. Abdominal pain, left lower quadrant - pain is improved, given it is Friday afternoon, I have provided him a wait and see antibiotic (to cover for diverticulitis) if he does not improve or gets worse over next several days - RTC/ER precautions reviewed- fever, severe pain, inability to hold down fluids - amoxicillin-clavulanate (AUGMENTIN) 875-125 MG tablet; Take 1 tablet by mouth 2 (two) times daily.  Dispense: 14 tablet; Refill: 0 - discussed need for further work up if continued episodes  Clarene Reamer, FNP-BC  [facility], [place]  12/08/2016 2:20 PM

## 2016-12-08 NOTE — Patient Instructions (Signed)
If pain gets worse over next couple of day, you can start antibiotic   Diverticulitis Diverticulitis is inflammation or infection of small pouches in your colon that form when you have a condition called diverticulosis. The pouches in your colon are called diverticula. Your colon, or large intestine, is where water is absorbed and stool is formed. Complications of diverticulitis can include:  Bleeding.  Severe infection.  Severe pain.  Perforation of your colon.  Obstruction of your colon. What are the causes? Diverticulitis is caused by bacteria. Diverticulitis happens when stool becomes trapped in diverticula. This allows bacteria to grow in the diverticula, which can lead to inflammation and infection. What increases the risk? People with diverticulosis are at risk for diverticulitis. Eating a diet that does not include enough fiber from fruits and vegetables may make diverticulitis more likely to develop. What are the signs or symptoms? Symptoms of diverticulitis may include:  Abdominal pain and tenderness. The pain is normally located on the left side of the abdomen, but may occur in other areas.  Fever and chills.  Bloating.  Cramping.  Nausea.  Vomiting.  Constipation.  Diarrhea.  Blood in your stool. How is this diagnosed? Your health care provider will ask you about your medical history and do a physical exam. You may need to have tests done because many medical conditions can cause the same symptoms as diverticulitis. Tests may include:  Blood tests.  Urine tests.  Imaging tests of the abdomen, including X-rays and CT scans. When your condition is under control, your health care provider may recommend that you have a colonoscopy. A colonoscopy can show how severe your diverticula are and whether something else is causing your symptoms. How is this treated? Most cases of diverticulitis are mild and can be treated at home. Treatment may include:  Taking  over-the-counter pain medicines.  Following a clear liquid diet.  Taking antibiotic medicines by mouth for 7-10 days. More severe cases may be treated at a hospital. Treatment may include:  Not eating or drinking.  Taking prescription pain medicine.  Receiving antibiotic medicines through an IV tube.  Receiving fluids and nutrition through an IV tube.  Surgery. Follow these instructions at home:  Follow your health care provider's instructions carefully.  Follow a full liquid diet or other diet as directed by your health care provider. After your symptoms improve, your health care provider may tell you to change your diet. He or she may recommend you eat a high-fiber diet. Fruits and vegetables are good sources of fiber. Fiber makes it easier to pass stool.  Take fiber supplements or probiotics as directed by your health care provider.  Only take medicines as directed by your health care provider.  Keep all your follow-up appointments. Contact a health care provider if:  Your pain does not improve.  You have a hard time eating food.  Your bowel movements do not return to normal. Get help right away if:  Your pain becomes worse.  Your symptoms do not get better.  Your symptoms suddenly get worse.  You have a fever.  You have repeated vomiting.  You have bloody or black, tarry stools. This information is not intended to replace advice given to you by your health care provider. Make sure you discuss any questions you have with your health care provider. Document Released: 08/23/2005 Document Revised: 04/20/2016 Document Reviewed: 10/08/2013 Elsevier Interactive Patient Education  2017 [employer].

## 2017-01-25 DIAGNOSIS — M542 Cervicalgia: Secondary | ICD-10-CM | POA: Diagnosis not present

## 2017-05-25 DIAGNOSIS — L309 Dermatitis, unspecified: Secondary | ICD-10-CM | POA: Diagnosis not present

## 2017-05-25 DIAGNOSIS — D225 Melanocytic nevi of trunk: Secondary | ICD-10-CM | POA: Diagnosis not present

## 2017-05-25 DIAGNOSIS — D492 Neoplasm of unspecified behavior of bone, soft tissue, and skin: Secondary | ICD-10-CM | POA: Diagnosis not present

## 2017-08-07 ENCOUNTER — Ambulatory Visit (INDEPENDENT_AMBULATORY_CARE_PROVIDER_SITE_OTHER): Payer: 59 | Admitting: Family Medicine

## 2017-08-07 ENCOUNTER — Encounter: Payer: Self-pay | Admitting: Family Medicine

## 2017-08-07 ENCOUNTER — Ambulatory Visit (INDEPENDENT_AMBULATORY_CARE_PROVIDER_SITE_OTHER)
Admission: RE | Admit: 2017-08-07 | Discharge: 2017-08-07 | Disposition: A | Discharge status: Home / Self Care | Payer: 59 | Source: Ambulatory Visit | Attending: Family Medicine | Admitting: Family Medicine

## 2017-08-07 VITALS — BP 134/84 | HR 93 | Temp 98.3°F | Wt 220.0 lb

## 2017-08-07 DIAGNOSIS — Z981 Arthrodesis status: Secondary | ICD-10-CM

## 2017-08-07 DIAGNOSIS — M5010 Cervical disc disorder with radiculopathy, unspecified cervical region: Secondary | ICD-10-CM

## 2017-08-07 DIAGNOSIS — M542 Cervicalgia: Secondary | ICD-10-CM | POA: Diagnosis not present

## 2017-08-07 MED ORDER — PREDNISONE 20 MG PO TABS: ORAL_TABLET | ORAL | 0 refills | Status: DC

## 2017-08-07 NOTE — Progress Notes (Addendum)
BP 134/84 (BP Location: Left Arm, Patient Position: Sitting, Cuff Size: Large)   Pulse 93   Temp 98.3 F (36.8 C) (Oral)   Wt 220 lb (99.8 kg)   SpO2 97%   BMI 30.68 kg/m    CC: neck pain Subjective:    Patient ID: David Collins, male    DOB: 03/22/1966, 51 y.o.   MRN: [id number]  HPI: David Collins is a 51 y.o. male presenting on 08/07/2017 for Neck Pain (Persisitant for many yrs. Increased within last week after hearing a "pop" on left side. Request xray)   Chronic neck pain for years, acutely worse over last week along with left sided popping sound associated with dizziness feeling. Worsening pain that lasts several hours at a time. Was seen at [facility] ER last week for neck pain.   H/o neck surgery through [facility] 1 yr ago - some kind of anterior neck surgery.  Takes tramadol and robaxin at bedtime through [place]. He finds tramadol helps. He also takes aleve 220mg  in the morning.   Has f/u appt on Friday.   + shooting pain down L>R arm, L arm paresthesias to 5th finger.  Denies recent trauma/injury or fall.   Relevant past medical, surgical, family and social history reviewed and updated as indicated. Interim medical history since our last visit reviewed. Allergies and medications reviewed and updated. Outpatient Medications Prior to Visit  Medication Sig Dispense Refill  . methocarbamol (ROBAXIN) 750 MG tablet Take 750 mg by mouth at bedtime as needed.     . traMADol (ULTRAM) 50 MG tablet Take 50 mg by mouth every 6 (six) hours as needed for pain.    Marland Kitchen amoxicillin-clavulanate (AUGMENTIN) 875-125 MG tablet Take 1 tablet by mouth 2 (two) times daily. 14 tablet 0   No facility-administered medications prior to visit.      Per HPI unless specifically indicated in ROS section below Review of Systems     Objective:    BP 134/84 (BP Location: Left Arm, Patient Position: Sitting, Cuff Size: Large)   Pulse 93   Temp 98.3 F (36.8 C) (Oral)   Wt 220 lb (99.8 kg)   SpO2 97%   BMI 30.68  kg/m   Wt Readings from Last 3 Encounters:  08/07/17 220 lb (99.8 kg)  12/08/16 227 lb (103 kg)  11/08/16 223 lb 8 oz (101.4 kg)    Physical Exam  Constitutional: He is oriented to person, place, and time. He appears well-developed and well-nourished. No distress.  Musculoskeletal: He exhibits no edema.  Neck supple without stiffness FROM at neck, discomfort with movement to left Tenderness just left of midline at C6 level FROM at shoulders  Neurological: He is alert and oriented to person, place, and time. He has normal strength. No sensory deficit.  5/5 BUE strength Grip strength intact Neg spurling  Skin: Skin is warm and dry. No rash noted.  Nursing note and vitals reviewed.      Assessment & Plan:  Increased hypertension and tachycardia likely pain response. Reviewed with patient.  Problem List Items Addressed This Visit    Cervical disc disorder with radiculopathy of cervical region - Primary    S/p anterior cervical surgery last year.  Acute worsening cervical neck pain with worsening radiculopathy L>R over the last week. In h/o surgery last year, will check xray to eval hardware.  Rx prednisone taper.  [facility]       Relevant Orders   DG  Cervical Spine Complete   S/P cervical spinal fusion       Follow up plan: Return if symptoms worsen or fail to improve.  Ria Bush, MD

## 2017-08-07 NOTE — Patient Instructions (Addendum)
Xray today. We will be in touch with results.  Prednisone prescription provided today.  [facility].

## 2017-08-07 NOTE — Assessment & Plan Note (Signed)
S/p anterior cervical surgery last year.  Acute worsening cervical neck pain with worsening radiculopathy L>R over the last week. In h/o surgery last year, will check xray to eval hardware.  Rx prednisone taper.  [facility].

## 2017-08-30 DIAGNOSIS — D225 Melanocytic nevi of trunk: Secondary | ICD-10-CM | POA: Diagnosis not present

## 2017-08-30 DIAGNOSIS — L820 Inflamed seborrheic keratosis: Secondary | ICD-10-CM | POA: Diagnosis not present

## 2017-08-30 DIAGNOSIS — D485 Neoplasm of uncertain behavior of skin: Secondary | ICD-10-CM | POA: Diagnosis not present

## 2017-09-18 DIAGNOSIS — R42 Dizziness and giddiness: Secondary | ICD-10-CM | POA: Diagnosis not present

## 2018-06-04 NOTE — Progress Notes (Signed)
Dr. Frederico Hamman T. Laquita Harlan, MD, [facility] [place], [address] Phone: [phone] Fax: [phone]  06/05/2018  Patient: David Collins, MRN: [id number], DOB: June 19, 1966, 52 y.o.  Primary Physician:  Owens Loffler, MD   Chief Complaint  Patient presents with  . Toe Injury    Left big toe  . Elbow Pain    Right  . Ear Pain    Right   Subjective:   David Collins presents with lateral elbow pain.  Length of symptoms: 1 mo Hand effected: R  Patient describes a dull ache on the lateral elbow. There is some translation in the proximal forearm and in the distal upper arm. It is painful to lift with the hand facing down and to lift with the thumb in an upright position. Supination is painful. Patient points to the lateral epicondyle as the point of maximal tenderness near ECRB.  L great toe pain. He hit his great toe 1 week ago and had a subungal hematoma  R elbow pain. LE. aabout a month, got month and has seemingly gotten better.   R ear pain. Fullness and stuffiness.  No trauma.   No prior fractures or operative interventions in the effective hand. Prior PT or HEP: none  Denies numbness or tingling. No significant neck or shoulder pain.  Hand of dominance:  The PMH, PSH, Social History, Family History, Medications, and allergies have been reviewed in [facility], and have been updated if relevant.  Patient Active Problem List   Diagnosis Date Noted  . S/P cervical spinal fusion 08/15/2016  . 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 Bilateral carpal tunnel syndrome 05/17/2016  . Smoker 08/23/2014  . Lumbar radiculopathy 01/29/2013  . Cervical disc disorder with radiculopathy of cervical region 01/29/2013  . Hyperlipidemia LDL goal <100 01/29/2013  . Hypertension     Past Medical History:  Diagnosis Date  . Back pain   . Hyperlipidemia 01/29/2013  . Hypertension   . Sciatic nerve pain     Past  Surgical History:  Procedure Laterality Date  . ANTERIOR CERVICAL DISCECTOMY  2017   through [place] History   Socioeconomic History  . Marital status: Divorced    Spouse name: Not on file  . Number of children: Not on file  . Years of education: Not on file  . Highest education level: Not on file  Occupational History    Employer: [employer]    Comment: Dance movement psychotherapist  Social Needs  . Financial resource strain: Not on file  . Food insecurity:    Worry: Not on file    Inability: Not on file  . Transportation needs:    Medical: Not on file    Non-medical: Not on file  Tobacco Use  . Smoking status: Former Smoker    Packs/day: 0.75    Types: Cigarettes    Last attempt to quit: 09/03/2014    Years since quitting: 3.7  . Smokeless tobacco: Current User  . Tobacco comment: E-Cig  Substance and Sexual Activity  . Alcohol use: Yes    Alcohol/week: 0.0 oz    Comment: occ  . Drug use: No  . Sexual activity: Not on file  Lifestyle  . Physical activity:    Days per week: Not on file    Minutes per session: Not on file  . Stress: Not on file  Relationships  . Social connections:    Talks  on phone: Not on file    Gets together: Not on file    Attends religious service: Not on file    Active member of club or organization: Not on file    Attends meetings of clubs or organizations: Not on file    Relationship status: Not on file  . Intimate partner violence:    Fear of current or ex partner: Not on file    Emotionally abused: Not on file    Physically abused: Not on file    Forced sexual activity: Not on file  Other Topics Concern  . 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 Skin cancer Unknown        parents    Allergies  Allergen Reactions  . Sulfa Antibiotics Rash    Medication list reviewed and updated in full in [place].  GEN:  No fevers, chills. Nontoxic. Primarily MSK c/o today. MSK: Detailed in the HPI GI: tolerating PO intake without difficulty Neuro: No numbness, parasthesias, or tingling associated. Otherwise the pertinent positives of the ROS are noted above.   Objective:   Blood pressure 124/90, pulse 91, temperature 98.9 F (37.2 C), temperature source Oral, height 5\' 11"  (1.803 m), weight 229 lb 12 oz (104.2 kg).  GEN: Well-developed,well-nourished,in no acute distress; alert,appropriate and cooperative throughout examination HEENT: Normocephalic and atraumatic without obvious abnormalities. Ears, externally no deformities, but B fluid behind the TM. PULM: Breathing comfortably in no respiratory distress EXT: No clubbing, cyanosis, or edema PSYCH: Normally interactive. Cooperative during the interview. Pleasant. Friendly and conversant. Not anxious or depressed appearing. Normal, full affect.  L elbow Ecchymosis or edema: neg ROM: full flexion, extension, pronation, supination Shoulder ROM: Full Flexion: 5/5 Extension: 5/5, PAINFUL Supination: 5/5, PAINFUL Pronation: 5/5 Wrist ext: 5/5 Wrist flexion: 5/5 No gross bony abnormality Varus and Valgus stress: stable ECRB tenderness: YES, TTP Medial epicondyle: NT Lateral epicondyle, resisted wrist extension from wrist full pronation and flexion: PAINFUL grip: 5/5  sensation intact Tinel's, Elbow: negative  Subjective:   Lateral epicondylitis, right elbow  ETD (Eustachian tube dysfunction), unspecified laterality  Toe hematoma, left, initial encounter  >25 minutes spent in face to face time with patient, >50% spent in counselling or coordination of care   Elbow anatomy was reviewed, and tendinopathy was explained. Pt. given a formal rehab program from [facility] on elbow rehabiliation.  Series of concentric and eccentric exercises should be done starting with no weight, work up to 1 lb, hammer, etc.  Use counterforce strap if working  or using hands. Formal PT would be beneficial. Emphasized stretching an cross-friction massage Emphasized proper palms up lifting biomechanics to unload ECRB  Reviewed ETD  Reassured about toe.  Follow-up: No follow-ups on file.  Signed,  Maud Deed. Thadius Smisek, MD  Patient Instructions  Theraband flexbar, RED  Eustachian Tube Dysfunction: There is a tube that connects between the sinuses and behind the ear called the "eustachian tube." Sometimes when you have allergies, a cold, or nasal congestion for any reason this tube can get blocked and pressure cannot equalize in your ears. (Like if you swim in deep water) This can also trap fluid behind the ear and give you a full, pressure-like sensation that is uncomfortable, but it is not an ear infection.  Recommendations: Afrin Nasal Spray: 2 sprays twice a day for a maximum of 3-4 days (longer  than this and your nose gets addicted, and you have rebound swelling that makes it worse.) Sudafed: Either pseudoephedrine or phenylephrine. As directed on box. (Not is heart problems or high blood pressure) Anti-Histamine: Allegra, Zyrtec, or Claritin. All over the counter now and once a day.  Nasal steroid. Nasacort is over the counter now. Flonase, too.   If you develop fever > 100.4, then things can change fluid behind the ear does increase your risk of developing an ear infection.      Patient's Medications  New Prescriptions   No medications on file  Previous Medications   METHOCARBAMOL (ROBAXIN) 750 MG TABLET    Take 750 mg by mouth at bedtime as needed.    TRAMADOL (ULTRAM) 50 MG TABLET    Take 50 mg by mouth every 6 (six) hours as needed for pain.  Modified Medications   No medications on file  Discontinued Medications   PREDNISONE (DELTASONE) 20 MG TABLET    Take two tablets daily for 3 days followed by one tablet daily for 4 days

## 2018-06-05 ENCOUNTER — Encounter: Payer: Self-pay | Admitting: Family Medicine

## 2018-06-05 ENCOUNTER — Ambulatory Visit: Payer: 59 | Admitting: Family Medicine

## 2018-06-05 VITALS — BP 124/90 | HR 91 | Temp 98.9°F | Ht 71.0 in | Wt 229.8 lb

## 2018-06-05 DIAGNOSIS — M7711 Lateral epicondylitis, right elbow: Secondary | ICD-10-CM | POA: Diagnosis not present

## 2018-06-05 DIAGNOSIS — H6980 Other specified disorders of Eustachian tube, unspecified ear: Secondary | ICD-10-CM | POA: Diagnosis not present

## 2018-06-05 DIAGNOSIS — S90122A Contusion of left lesser toe(s) without damage to nail, initial encounter: Secondary | ICD-10-CM

## 2018-06-05 NOTE — Patient Instructions (Signed)
Theraband flexbar, RED  Eustachian Tube Dysfunction: There is a tube that connects between the sinuses and behind the ear called the "eustachian tube." Sometimes when you have allergies, a cold, or nasal congestion for any reason this tube can get blocked and pressure cannot equalize in your ears. (Like if you swim in deep water) This can also trap fluid behind the ear and give you a full, pressure-like sensation that is uncomfortable, but it is not an ear infection.  Recommendations: Afrin Nasal Spray: 2 sprays twice a day for a maximum of 3-4 days (longer than this and your nose gets addicted, and you have rebound swelling that makes it worse.) Sudafed: Either pseudoephedrine or phenylephrine. As directed on box. (Not is heart problems or high blood pressure) Anti-Histamine: Allegra, Zyrtec, or Claritin. All over the counter now and once a day.  Nasal steroid. Nasacort is over the counter now. Flonase, too.   If you develop fever > 100.4, then things can change fluid behind the ear does increase your risk of developing an ear infection.

## 2018-06-28 DIAGNOSIS — Z0189 Encounter for other specified special examinations: Secondary | ICD-10-CM | POA: Diagnosis not present

## 2018-06-28 DIAGNOSIS — Z125 Encounter for screening for malignant neoplasm of prostate: Secondary | ICD-10-CM | POA: Diagnosis not present

## 2018-08-07 ENCOUNTER — Ambulatory Visit: Payer: 59 | Admitting: Family Medicine

## 2018-08-07 ENCOUNTER — Encounter: Payer: Self-pay | Admitting: Family Medicine

## 2018-08-07 VITALS — BP 120/82 | HR 97 | Temp 98.5°F | Ht 71.0 in | Wt 232.5 lb

## 2018-08-07 DIAGNOSIS — M7711 Lateral epicondylitis, right elbow: Secondary | ICD-10-CM

## 2018-08-07 MED ORDER — METHYLPREDNISOLONE ACETATE 40 MG/ML IJ SUSP
20.0000 mg | Freq: Once | INTRAMUSCULAR | Status: AC
Start: 2018-08-07 — End: 2018-08-07
  Administered 2018-08-07: 20 mg via INTRA_ARTICULAR

## 2018-08-07 NOTE — Progress Notes (Signed)
Dr. Frederico Hamman T. Tandre Conly, MD, [facility] Medicine [place], [address] Phone: [phone] Fax: [phone]  08/07/2018  Patient: David Collins, MRN: [id number], DOB: 1966/08/09, 52 y.o.  Primary Physician:  Owens Loffler, MD   Chief Complaint  Patient presents with  . Follow-up    Right Elbow   Subjective:   David Collins is a 52 y.o. very pleasant male patient who presents with the following:  r le.  Patient presents with follow-up on his right sided lateral epicondylitis.  He has been minimally compliant on his home rehab program, he is still having persistent pain, this is been ongoing now for several months.  He is tried some oral over-the-counter medications as well as some ice and other topical remedies without much success.  R LE inj  Past Medical History, Surgical History, Social History, Family History, Problem List, Medications, and Allergies have been reviewed and updated if relevant.  Patient Active Problem List   Diagnosis Date Noted  . S/P cervical spinal fusion 08/15/2016  . 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 Bilateral carpal tunnel syndrome 05/17/2016  . Smoker 08/23/2014  . Lumbar radiculopathy 01/29/2013  . Cervical disc disorder with radiculopathy of cervical region 01/29/2013  . Hyperlipidemia LDL goal <100 01/29/2013  . Hypertension     Past Medical History:  Diagnosis Date  . Back pain   . Hyperlipidemia 01/29/2013  . Hypertension   . Sciatic nerve pain     Past Surgical History:  Procedure Laterality Date  . ANTERIOR CERVICAL DISCECTOMY  2017   through [facility] History   Socioeconomic History  . Marital status: Divorced    Spouse name: Not on file  . Number of children: Not on file  . Years of education: Not on file  . Highest education level: Not on file  Occupational History    Employer: [employer]    Comment: Dance movement psychotherapist  Social Needs  .  Financial resource strain: Not on file  . Food insecurity:    Worry: Not on file    Inability: Not on file  . Transportation needs:    Medical: Not on file    Non-medical: Not on file  Tobacco Use  . Smoking status: Former Smoker    Packs/day: 0.75    Types: Cigarettes    Last attempt to quit: 09/03/2014    Years since quitting: 3.9  . Smokeless tobacco: Current User  . Tobacco comment: E-Cig  Substance and Sexual Activity  . Alcohol use: Yes    Alcohol/week: 0.0 standard drinks    Comment: occ  . Drug use: No  . Sexual activity: Not on file  Lifestyle  . Physical activity:    Days per week: Not on file    Minutes per session: Not on file  . Stress: Not on file  Relationships  . Social connections:    Talks on phone: Not on file    Gets together: Not on file    Attends religious service: Not on file    Active member of club or organization: Not on file    Attends meetings of clubs or organizations: Not on file    Relationship status: Not on file  . Intimate partner violence:    Fear of current or ex partner: Not on file    Emotionally abused: Not on file    Physically abused: Not on file    Forced sexual  activity: Not on file  Other Topics Concern  . 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 Skin cancer Unknown        parents    Allergies  Allergen Reactions  . Sulfa Antibiotics Rash    Medication list reviewed and updated in full in [place].  GEN: No fevers, chills. Nontoxic. Primarily MSK c/o today. MSK: Detailed in the HPI GI: tolerating PO intake without difficulty Neuro: No numbness, parasthesias, or tingling associated. Otherwise the pertinent positives of the ROS are noted above.   Objective:   BP 120/82   Pulse 97   Temp 98.5 F (36.9 C) (Oral)   Ht 5\' 11"  (1.803 m)   Wt 232 lb 8 oz (105.5 kg)   BMI 32.43 kg/m    GEN: WDWN,  NAD, Non-toxic, Alert & Oriented x 3 HEENT: Atraumatic, Normocephalic.  Ears and Nose: No external deformity. EXTR: No clubbing/cyanosis/edema NEURO: Normal gait.  PSYCH: Normally interactive. Conversant. Not depressed or anxious appearing.  Calm demeanor.   Elbow: R Ecchymosis or edema: neg ROM: full flexion, extension, pronation, supination Shoulder ROM: Full Flexion: 5/5 Extension: 5/5, PAINFUL Supination: 5/5, PAINFUL Pronation: 5/5 Wrist ext: 5/5 Wrist flexion: 5/5 No gross bony abnormality Varus and Valgus stress: stable ECRB tenderness: YES, TTP Medial epicondyle: NT Lateral epicondyle, resisted wrist extension from wrist full pronation and flexion: PAINFUL grip: 5/5  sensation intact Tinel's, Elbow: negative    Radiology: No results found.  Assessment and Plan:   Lateral epicondylitis, right elbow - Plan: methylPREDNISolone acetate (DEPO-MEDROL) injection 20 mg  Failure to improve thus far with classical conservative management.  Get a try to do a tennis elbow injection to give him some relief in he is going to be more diligent about doing home rehab.  Offered him a formal physical therapy rehabilitation, which I think would be quite helpful, but he did not want to do this at this point.  Lateral Epicondylitis Injection, R Verbal consent was obtained from the patient. Risks, benefits, and alternatives were discussed. Potential complications including loss of pigment, atrophy, and rare risk of infection were discussed. Prepped with Chloraprep and Ethyl Chloride used for anesthesia. Under sterile conditions, the patient was injected at the point of maximal tenderness at the ECRB tendon with 2 cc of Lidocaine 1% and 1 cc of Depo-Medrol 40 mg. Decreased pain after injection. No complications.  Needle size: 22 gauge 1 1/2 inch   Follow-up: prn  Meds ordered this encounter  Medications  . methylPREDNISolone acetate (DEPO-MEDROL) injection 20 mg   Signed,  Favour Aleshire  T. Taivon Haroon, MD   Allergies as of 08/07/2018      Reactions   Sulfa Antibiotics Rash      Medication List        Accurate as of 08/07/18 11:59 PM. Always use your most recent med list.          methocarbamol 750 MG tablet Commonly known as:  ROBAXIN Take 750 mg by mouth at bedtime as needed.   traMADol 50 MG tablet Commonly known as:  ULTRAM Take 50 mg by mouth every 6 (six) hours as needed for pain.

## 2018-12-05 DIAGNOSIS — R253 Fasciculation: Secondary | ICD-10-CM | POA: Diagnosis not present

## 2018-12-12 DIAGNOSIS — R253 Fasciculation: Secondary | ICD-10-CM | POA: Diagnosis not present

## 2018-12-16 ENCOUNTER — Encounter: Payer: Self-pay | Admitting: Family Medicine

## 2018-12-16 ENCOUNTER — Ambulatory Visit: Payer: 59 | Admitting: Family Medicine

## 2018-12-16 VITALS — BP 130/88 | HR 98 | Temp 98.4°F | Ht 71.0 in | Wt 233.2 lb

## 2018-12-16 DIAGNOSIS — M7711 Lateral epicondylitis, right elbow: Secondary | ICD-10-CM | POA: Diagnosis not present

## 2018-12-16 DIAGNOSIS — H6981 Other specified disorders of Eustachian tube, right ear: Secondary | ICD-10-CM | POA: Diagnosis not present

## 2018-12-16 NOTE — Progress Notes (Signed)
Subjective:     Arty Lantzy is a 53 y.o. male presenting for Ear Pain (Pain has been present for a while off and on but has gotten worse in the past few days. Right ear. Some nasal drainage. No fever. No ear drainage.)     Otalgia   There is pain in the right ear. The current episode started more than 1 month ago. The problem has been gradually worsening. There has been no fever. The pain is mild. Associated symptoms include hearing loss and a sore throat. Pertinent negatives include no abdominal pain, coughing, ear discharge, headaches, rhinorrhea or vomiting. Treatments tried: antihistamines. The treatment provided no relief. There is no history of a chronic ear infection.   Will clear up for a few weeks and then come back  Worse with certain positions of the neck/jaw   #Lateral epicondylitis - right elbow pain - improved after injection, wondering if he can get another one - did some home exercises - never wore a brace - symptoms returning, works at a computer all day  Review of Systems  HENT: Positive for ear pain, hearing loss, postnasal drip and sore throat. Negative for congestion, ear discharge, rhinorrhea, sinus pressure and sinus pain.        Ear fullness  Respiratory: Negative for cough.   Gastrointestinal: Negative for abdominal pain and vomiting.  Neurological: Negative for headaches.     Social History   Tobacco Use  Smoking Status Former Smoker  . Packs/day: 0.75  . Types: Cigarettes  . Last attempt to quit: 09/03/2014  . Years since quitting: 4.2  Smokeless Tobacco Current User  Tobacco Comment   E-Cig        Objective:    BP Readings from Last 3 Encounters:  12/16/18 130/88  08/07/18 120/82  06/05/18 124/90   Wt Readings from Last 3 Encounters:  12/16/18 233 lb 4 oz (105.8 kg)  08/07/18 232 lb 8 oz (105.5 kg)  06/05/18 229 lb 12 oz (104.2 kg)    BP 130/88   Pulse 98   Temp 98.4 F (36.9 C)   Ht 5\' 11"  (1.803 m)   Wt 233 lb 4 oz  (105.8 kg)   SpO2 98%   BMI 32.53 kg/m    Physical Exam Constitutional:      General: He is not in acute distress.    Appearance: Normal appearance. He is well-developed. He is not ill-appearing or diaphoretic.  HENT:     Head: Normocephalic and atraumatic.     Right Ear: Ear canal and external ear normal. A middle ear effusion is present. Tympanic membrane is not erythematous, retracted or bulging.     Left Ear: Tympanic membrane, ear canal and external ear normal.     Nose: Nose normal.     Right Sinus: No maxillary sinus tenderness or frontal sinus tenderness.     Left Sinus: No maxillary sinus tenderness or frontal sinus tenderness.     Mouth/Throat:     Pharynx: Uvula midline. Posterior oropharyngeal erythema present. No oropharyngeal exudate.     Tonsils: Swelling: 0 on the right. 0 on the left.  Eyes:     General: No scleral icterus.    Extraocular Movements: Extraocular movements intact.     Conjunctiva/sclera: Conjunctivae normal.  Neck:     Musculoskeletal: Neck supple.  Cardiovascular:     Rate and Rhythm: Normal rate and regular rhythm.     Heart sounds: No murmur.  Pulmonary:     Effort: Pulmonary  effort is normal. No respiratory distress.     Breath sounds: Normal breath sounds.  Musculoskeletal:     Comments: Right elbow with scar. ROM of normal. Remainder of exam deferred  Lymphadenopathy:     Cervical: No cervical adenopathy.  Skin:    General: Skin is warm and dry.     Capillary Refill: Capillary refill takes less than 2 seconds.  Neurological:     Mental Status: He is alert. Mental status is at baseline.  Psychiatric:        Mood and Affect: Mood normal.           Assessment & Plan:   Problem List Items Addressed This Visit      Musculoskeletal and Integument   Lateral epicondylitis of right elbow    S/p steroid injection 07/2018. Advised trial of brace. Offered PT, but pt declined today. Advised follow-up if symptoms not improved.          Other Visit Diagnoses    Eustachian tube dysfunction, right    -  Primary     Eustachian tube  Patient Instructions  Sinus Congestion/Ear fullness 1) Neti Pot (Saline rinse) -- 2 times day -- if tolerated 2) Flonase (Store Brand ok) - once daily 3) Allergy Medication (Claritin, Zyrtec, Allegra - store brand OK)  If you develop fevers (Temperature >100.4), chills, worsening symptoms or symptoms lasting longer than 10 days return to clinic.       Return if symptoms worsen or fail to improve.  Lesleigh Noe, MD

## 2018-12-16 NOTE — Assessment & Plan Note (Signed)
S/p steroid injection 07/2018. Advised trial of brace. Offered PT, but pt declined today. Advised follow-up if symptoms not improved.

## 2018-12-16 NOTE — Patient Instructions (Signed)
Sinus Congestion/Ear fullness 1) Neti Pot (Saline rinse) -- 2 times day -- if tolerated 2) Flonase (Store Brand ok) - once daily 3) Allergy Medication (Claritin, Zyrtec, Allegra - store brand OK)  If you develop fevers (Temperature >100.4), chills, worsening symptoms or symptoms lasting longer than 10 days return to clinic.

## 2018-12-20 DIAGNOSIS — J00 Acute nasopharyngitis [common cold]: Secondary | ICD-10-CM | POA: Diagnosis not present

## 2019-07-11 IMAGING — DX DG CERVICAL SPINE COMPLETE 4+V
6 series · 6 of 6 positions shown · non-contrast
Comparison: None.

CLINICAL DATA: Acute worsening of left-sided neck pain.

EXAM:
CERVICAL SPINE - COMPLETE 4+ VIEW

[c-spine lat]
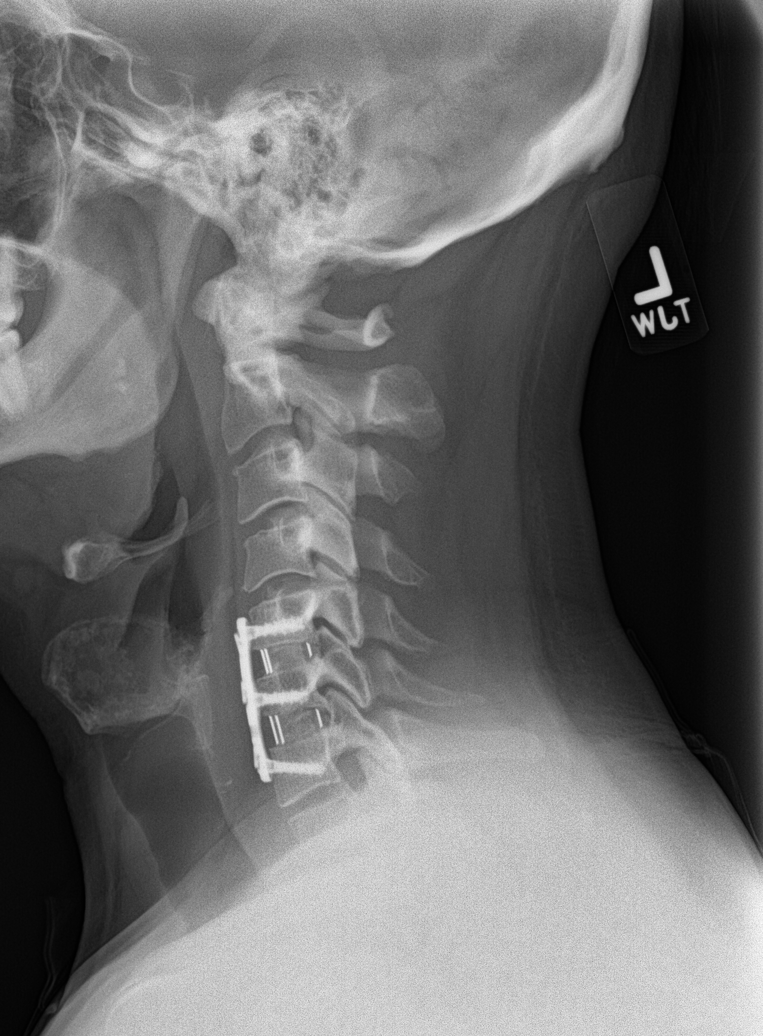

[c-spine obl (1 of 2)]
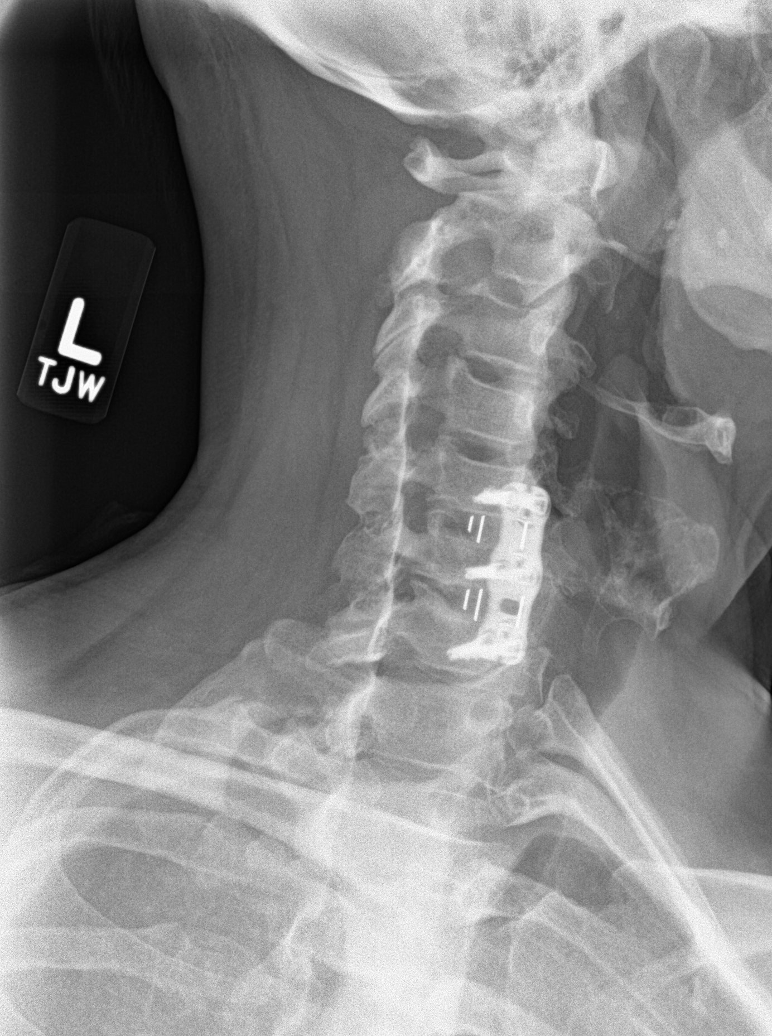

[c-spine obl (2 of 2)]
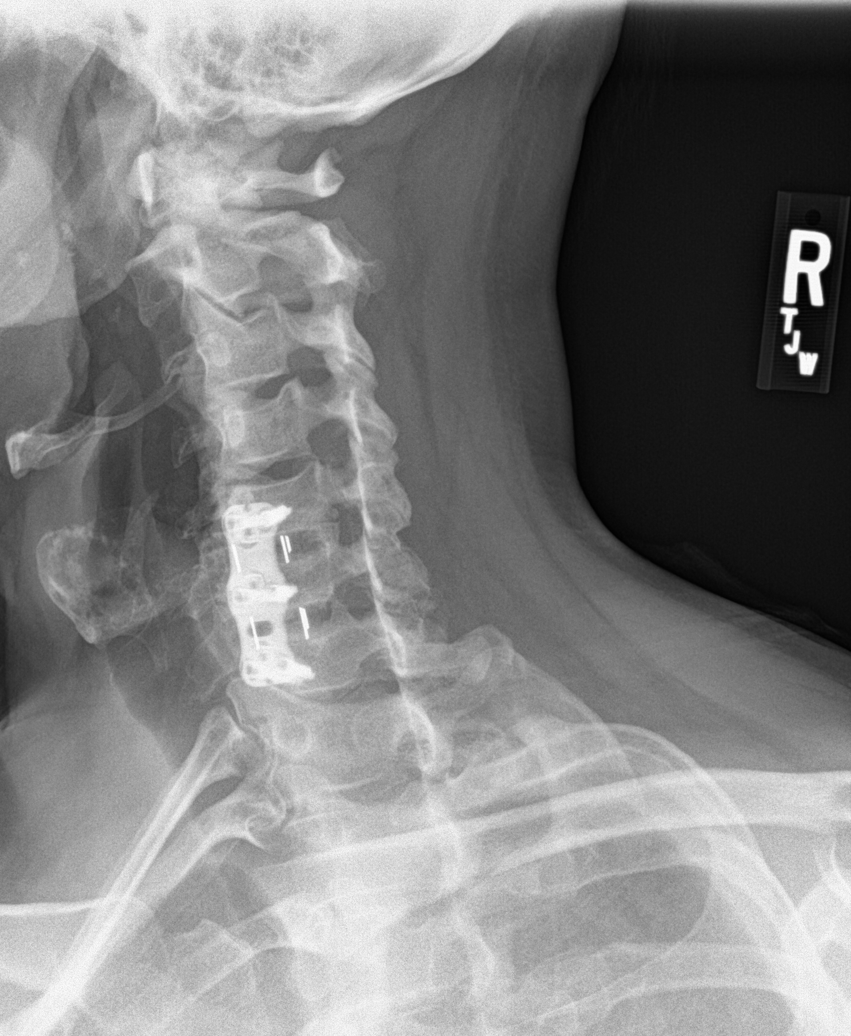

[c-spine ap]
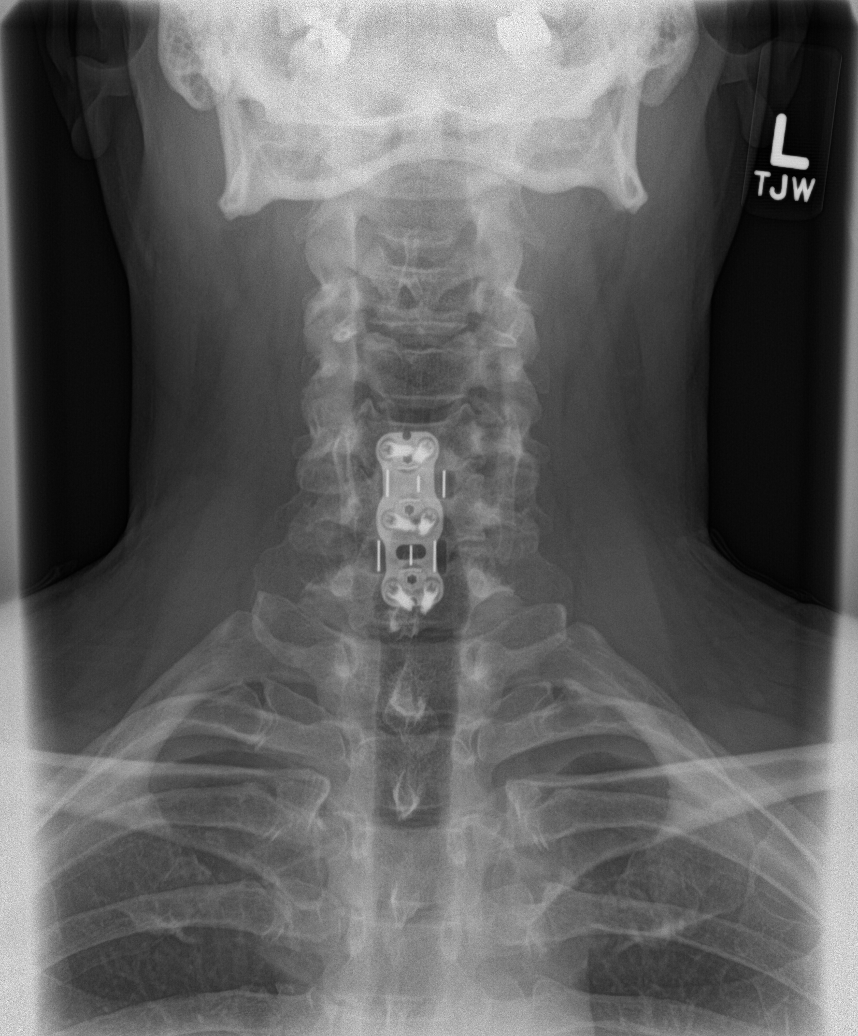

[c-spine open mouth]
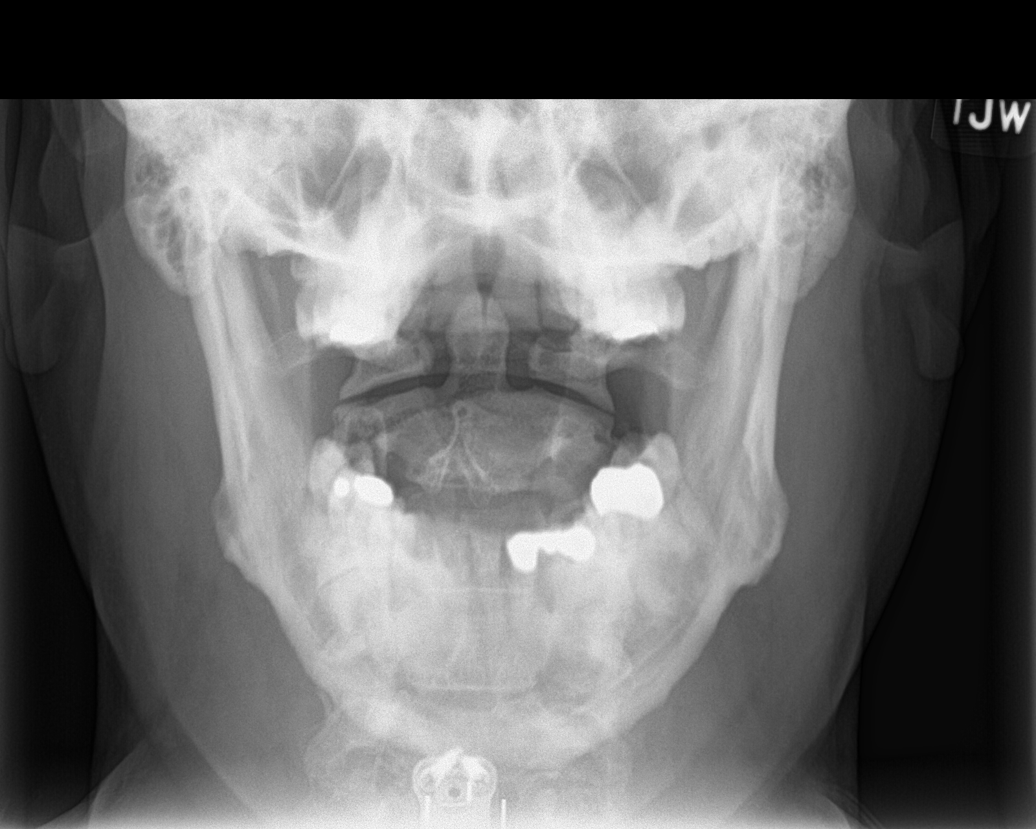

[c-spine swimmers]
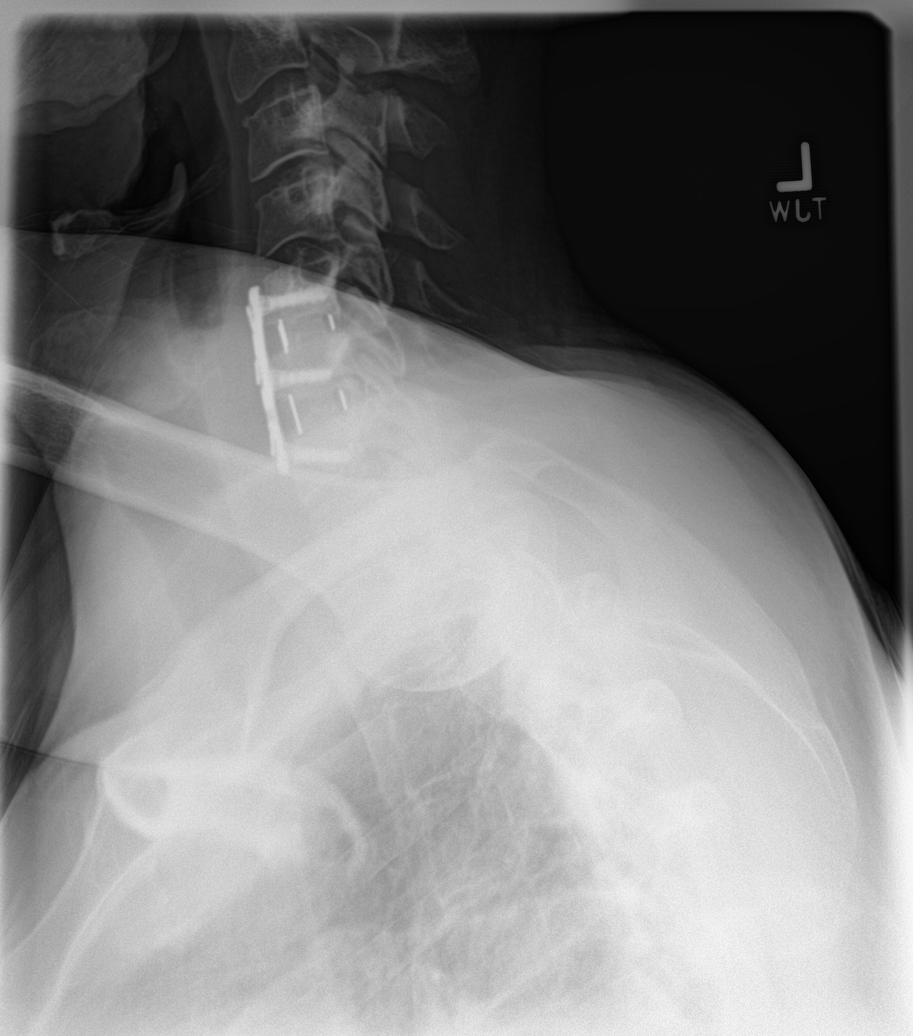

[6 of 6 positions shown; findings below may reference images not displayed]

FINDINGS: Previous anterior fusion C5-6 and C6-7. Was fusions appear solid.
Alignment of the cervical vertebra is normal.

No disc space narrowing or prevertebral soft tissue swelling. No
appreciable foraminal stenosis no significant sent. No facet
arthritis of significance.
IMPRESSION: 1. Solid anterior cervical fusions at C5-6 and C6-7.
2. Otherwise, essentially normal cervical spine.

## 2020-09-03 ENCOUNTER — Telehealth: Payer: Self-pay

## 2020-09-03 NOTE — Telephone Encounter (Signed)
Pt said this morning pt feeling woozy, lightheaded and tingling throughout body; pt took BP 170/106; on no BP meds but after resting quietly pt said BP now is 147/85. Pt does not know why BP would go high like that; pt said has pinched nerve in neck on and off but not sure if that would cause BP to go up that much. Pt has no H/a, CP or SOB. Pt wanted to be seen at [facility] today ;advised no available appt at [facility] and pt thought our office was an UC. Advised no and if pt was feeling OK now could schedule appt on Mon or if pt felt like he needed to be seen today pt would have his brother take him to [facility] on [place]. Pt said he was going to see if brother could take him to [facility] on [address] unless we could provide transportation; advised we do not have available transportation and pt could call 911 if needed but then would be taken to an ED. Pt voiced understanding and thinks he will go to UC on [address]. FYI to Dr Lorelei Pont.

## 2020-09-06 ENCOUNTER — Telehealth: Payer: Self-pay

## 2020-09-06 NOTE — Telephone Encounter (Signed)
I am not clear on this patient's disposition. Can you follow up and check with him. It sounds as if he needs to be seen today or tomorrow.

## 2020-09-06 NOTE — Telephone Encounter (Signed)
[facility] Night - Client Nonclinical Telephone Record AccessNurse Client [facility] Night - Client Client Site [place] Physician Owens Loffler - MD Contact Type Call Who Is Calling Patient / Member / Family / Caregiver Caller Name Jasten Guyette Caller Phone Number [phone] Patient Name Cailan General Patient DOB 08-Feb-1966 Call Type Message Only Information Provided Reason for Call Request to Schedule Office Appointment Initial Comment Caller states he spoke with triage, and told to schedule an appt for today if possible, asking for a call back. Additional Comment Provided information for a call back from the office. Disp. Time Disposition Final User 09/06/2020 7:52:46 AM General Information Provided Yes Rosana Fret Call Closed By: Rosana Fret Transaction Date/Time: 09/06/2020 7:50:26 AM (ET)

## 2020-09-06 NOTE — Telephone Encounter (Signed)
Pt was seen at [facility] ED on 09/03/20; see ED note; and pt spoke with access nurse on 09/04/20; pt said he was advised if he called today he would be triaged and appt would be scheduled for neck pain and elevated BP on and off. Pt is on no regular medications.per ED note pt had stopped drinking alcohol suddenly; per ED note pt declined detox and pt was advised by ED to go home and have a drink. Pt said he did have a drink or two yesterday. Earlier this morning BP 160/105 P 105. Pt was having neck pain that he has had on and off since neck surgery in 2017. Pt said now pain level in neck is 2. Pt does not have CP,SOB,H/A or dizziness.  Pt took low dose ASA and ate a banana earlier this morning; pt has been resting and BP at rest got down to 128/84. Now BP 132/89 P 80. Pt stated he is not in any distress at this time. Pt scheduled ED FU with Dr Lorelei Pont on 09/13/20 at 11 AM. Pt said he took today off to be seen at [facility]; there are no available appts today at [facility]; advised pt if felt like he needed to be seen today would need to go to [facility] or ED. Pt said he felt OK right now and if condition changes or worsens pt will cb to [facility] to see if available appts or go to UC. Pt is going to take low dose ASA and banana daily until seen. FYI to Dr Lorelei Pont and Butch Penny CMA.

## 2020-09-06 NOTE — Telephone Encounter (Signed)
[facility] Night - Client TELEPHONE ADVICE RECORD AccessNurse Patient Name: David Collins [facility] Gender: Male DOB: 1966/09/26 Age: 54 Y 40 M 18 D Return Phone Number: [phone] (Primary) Address: City/State/Zip: [place] [address] Client [facility] Night - Client Client Site [place] Physician Owens Loffler - MD Contact Type Call Who Is Calling Patient / Member / Family / Caregiver Call Type Triage / Clinical Relationship To Patient Self Return Phone Number [phone] (Primary) Chief Complaint NUMBNESS/TINGLING- sudden on one side of the body or face Reason for Call Symptomatic / Request for [place] states he has had neck pain and high blood pressure. 195/110, blurred vision, called ambulance and went to ER. Blood pressure is still up but better since asprin and bananas. 130/85 now. He is also having tingling down left arm, shoulder, and neck. Translation No Nurse Assessment Nurse: Derrel Nip, RN, Santiago Glad Date/Time Eilene Ghazi Time): 09/04/2020 1:58:29 PM Confirm and document reason for call. If symptomatic, describe symptoms. ---Caller states that he went to [facility] and then to ER yesterday at [facility] - Does the patient have any new or worsening symptoms? ---Yes Will a triage be completed? ---Yes Related visit to physician within the last 2 weeks? ---No Does the PT have any chronic conditions? (i.e. diabetes, asthma, this includes High risk factors for pregnancy, etc.) ---Yes List chronic conditions. ---Arthritis, Chronic Neck Pain Is this a behavioral health or substance abuse call? ---No Guidelines Guideline Title Affirmed Question Affirmed Notes Nurse Date/Time (Eastern Time) Neurologic Deficit [1] Numbness or tingling in one or both hands AND [2] is a chronic symptom (recurrent or ongoing AND present > 4 weeks) Derrel Nip, RN, Santiago Glad 09/04/2020  2:02:17 PM Disp. Time Eilene Ghazi Time) Disposition Final User 09/04/2020 1:56:56 PM Send to Urgent Starling Manns, Myriam Jacobson PLEASE NOTE: All timestamps contained within this report are represented as [place] Standard Time. CONFIDENTIALTY NOTICE: This fax transmission is intended only for the addressee. It contains information that is legally privileged, confidential or otherwise protected from use or disclosure. If you are not the intended recipient, you are strictly prohibited from reviewing, disclosing, copying using or disseminating any of this information or taking any action in reliance on or regarding this information. If you have received this fax in error, please notify us immediately by telephone so that we can arrange for its return to [place]. Phone: [phone], Toll-Free: [phone], Fax: [phone] Page: 2 of 2 Call Id: [id number] 09/04/2020 2:08:39 PM SEE PCP WITHIN 3 DAYS Yes Derrel Nip, RN, York Pellant Disagree/Comply Comply Caller Understands Yes PreDisposition Call Doctor Care Advice Given Per Guideline SEE PCP WITHIN 3 DAYS: * You need to be seen within 2 or 3 days. * PCP VISIT: Call your doctor (or NP/PA) during regular office hours and make an appointment. A clinic or urgent care center are good places to go for care if your doctor's office is closed or you can't get an appointment. NOTE: If office will be open tomorrow, tell caller to call then, not in 3 days. CALL BACK IF: * Constant numbness or weakness in arms or legs * Problems with bowel or bladder control * You become worse CARE ADVICE given per Neurologic Deficit (Adult) guideline. Referrals REFERRED TO PCP OFFICE

## 2020-09-06 NOTE — Telephone Encounter (Signed)
See triage note below.

## 2020-09-06 NOTE — Telephone Encounter (Signed)
Please see Rena note at the bottom of this message.  Patient is scheduled for 09/13/2020.

## 2020-09-13 ENCOUNTER — Ambulatory Visit: Payer: 59 | Admitting: Family Medicine

## 2020-09-13 ENCOUNTER — Other Ambulatory Visit: Payer: Self-pay

## 2020-09-13 ENCOUNTER — Encounter: Payer: Self-pay | Admitting: Family Medicine

## 2020-09-13 VITALS — BP 122/76 | HR 104 | Temp 98.0°F | Ht 71.0 in | Wt 224.0 lb

## 2020-09-13 DIAGNOSIS — F1010 Alcohol abuse, uncomplicated: Secondary | ICD-10-CM

## 2020-09-13 DIAGNOSIS — R030 Elevated blood-pressure reading, without diagnosis of hypertension: Secondary | ICD-10-CM

## 2020-09-13 NOTE — Progress Notes (Signed)
David Collins T. David Kurka, MD, [place] at [facility] [place], [address]  Phone: [phone]  FAX: [phone]  David Collins - 54 y.o. male  MRN [id number]  Date of Birth: 02/11/1966  Date: 09/13/2020  PCP: Owens Loffler, MD  Referral: Owens Loffler, MD  Chief Complaint  Patient presents with  . ER F/U Neck Pain and Elevated BP    Went to UC for Neck Pain and BP and they sent him to ER. BP was 192/110. Sent him to [facility] in [place].    This visit occurred during the SARS-CoV-2 public health emergency.  Safety protocols were in place, including screening questions prior to the visit, additional usage of staff PPE, and extensive cleaning of exam room while observing appropriate contact time as indicated for disinfecting solutions.   Subjective:   David Collins is a 54 y.o. very pleasant male patient with Body mass index is 31.24 kg/m. who presents with the following:  David Collins is here for ER f/u:  He went to [facility] ER:  When he went to the ER, he had not drank any ETOH for 24 hours, history of alcoholism.  He also was having some neck pain at the time.  All of his notes have been reviewed.  ER notes.  He did have blood pressure that was elevated at approximately 122/110 and he was sent to the ER for evaluation.  At that point was having quite a bit of neck pain.  He does drink per his report at least 6 beers a night upwards to 12 and greater.  Since his visit to the ER he is cut this back to about 4-5 drinks nightly.  He is normotensive today in the office.  Review of Systems is noted in the HPI, as appropriate  Objective:   BP 122/76 (BP Location: Left Arm, Patient Position: Sitting, Cuff Size: Large)   Pulse (!) 104   Temp 98 F (36.7 C)   Ht 5\' 11"  (1.803 m)   Wt 224 lb (101.6 kg)   SpO2 96%   BMI 31.24 kg/m   GEN: No acute distress; alert,appropriate. PULM: Breathing  comfortably in no respiratory distress PSYCH: Normally interactive.  CV: RRR, no m/g/r   Laboratory and Imaging Data:  Assessment and Plan:     ICD-10-CM   1. Elevated blood-pressure reading without diagnosis of hypertension  R03.0   2. Excessive drinking alcohol  F10.10    I think that this is occurring secondary to rebound hypertension after alcohol in the evening.  We talked about this and my recommendation would be for him to discontinue alcohol entirely.  He has not able to do this then decreasing down to 1-2 drinks nightly would help this.  For now no need for blood pressure medication.  Suspect that acute neck pain also played a role last week.  No orders of the defined types were placed in this encounter.  There are no discontinued medications. No orders of the defined types were placed in this encounter.   Follow-up: No follow-ups on file.  Signed,  David Collins. Lakedra Washington, MD   Outpatient Encounter Medications as of 09/13/2020  Medication Sig  . Multiple Vitamin (MULTIVITAMIN) tablet Take 1 tablet by mouth daily.  . traMADol (ULTRAM) 50 MG tablet Take 50 mg by mouth every 6 (six) hours as needed for pain.   No facility-administered encounter medications on file as of 09/13/2020.

## 2020-09-23 ENCOUNTER — Other Ambulatory Visit: Payer: Self-pay

## 2020-09-23 ENCOUNTER — Encounter: Payer: Self-pay | Admitting: Internal Medicine

## 2020-09-23 ENCOUNTER — Ambulatory Visit: Payer: 59 | Admitting: Internal Medicine

## 2020-09-23 VITALS — BP 128/86 | HR 96 | Temp 96.7°F | Wt 220.0 lb

## 2020-09-23 DIAGNOSIS — H65112 Acute and subacute allergic otitis media (mucoid) (sanguinous) (serous), left ear: Secondary | ICD-10-CM

## 2020-09-23 MED ORDER — AMOXICILLIN 500 MG PO CAPS: 500.0000 mg | ORAL_CAPSULE | Freq: Three times a day (TID) | ORAL | 0 refills | Status: DC

## 2020-09-23 NOTE — Progress Notes (Signed)
Subjective:    Patient ID: David Collins, male    DOB: 1966/07/20, 54 y.o.   MRN: [id number]  HPI  Pt presents to the clinic today with c/o left ear pain, left side sore throat. This started 2 days ago. He describes the ear pain as sharp, with decreased hearing. He has not noticed any drainage from the ear. He is not having any difficulty swallowing.  He denies runny nose, nasal congestion, loss of taste/smell, cough or SOB. He denies fever, chills or body aches. He has not had sick contacts that he is aware of. He has not tried any medications OTC for this but has used Peroxide in his left ear.  Review of Systems  Past Medical History:  Diagnosis Date  . Back pain   . Hyperlipidemia 01/29/2013  . Hypertension   . Sciatic nerve pain     Current Outpatient Medications  Medication Sig Dispense Refill  . Multiple Vitamin (MULTIVITAMIN) tablet Take 1 tablet by mouth daily.    . traMADol (ULTRAM) 50 MG tablet Take 50 mg by mouth every 6 (six) hours as needed for pain.    Marland Kitchen amoxicillin (AMOXIL) 500 MG capsule Take 1 capsule (500 mg total) by mouth 3 (three) times daily. 30 capsule 0   No current facility-administered medications for this visit.    Allergies  Allergen Reactions  . Sulfa Antibiotics Rash    Family History  Problem Relation Age of Onset  . Skin cancer Unknown        parents    Social History   Socioeconomic History  . Marital status: Divorced    Spouse name: Not on file  . Number of children: Not on file  . Years of education: Not on file  . Highest education level: Not on file  Occupational History    Employer: [employer]    Comment: Dance movement psychotherapist  Tobacco Use  . Smoking status: Former Smoker    Packs/day: 0.75    Types: Cigarettes    Quit date: 09/03/2014    Years since quitting: 6.0  . Smokeless tobacco: Current User  . Tobacco comment: E-Cig  Substance and Sexual Activity  . Alcohol use: Yes    Alcohol/week: 0.0 standard drinks     Comment: occ  . Drug use: No  . Sexual activity: Not on file  Other Topics Concern  . 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 Difficulty of Paying Living Expenses: Not on file  Food Insecurity:   . Worried About Charity fundraiser in the Last Year: Not on file  . Ran Out of Food in the Last Year: Not on file  Transportation Needs:   . Lack of Transportation (Medical): Not on file  . Lack of Transportation (Non-Medical): Not on file  Physical Activity:   . Days of Exercise per Week: Not on file  . Minutes of Exercise per Session: Not on file  Stress:   . Feeling of Stress : Not on file  Social Connections:   . Frequency of Communication with Friends and Family: Not on file  . Frequency of Social Gatherings with Friends and Family: Not on file  . Attends Religious Services: Not on file  . Active Member of Clubs or Organizations: Not on file  . Attends Archivist Meetings: Not on file  . Marital Status: Not  on file  Intimate Partner Violence:   . Fear of Current or Ex-Partner: Not on file  . Emotionally Abused: Not on file  . Physically Abused: Not on file  . Sexually Abused: Not on file     Constitutional: Denies fever, malaise, fatigue, headache or abrupt weight changes.  HEENT: Pt reports left ear pain, left side sore throat. Denies eye pain, eye redness, ringing in the ears, wax buildup, runny nose, nasal congestion, bloody nose. Respiratory: Denies difficulty breathing, shortness of breath, cough or sputum production.   Cardiovascular: Denies chest pain, chest tightness, palpitations or swelling in the hands or feet.   No other specific complaints in a complete review of systems (except as listed in HPI above).     Objective:   Physical Exam   BP 128/86   Pulse 96   Temp (!) 96.7 F (35.9 C) (Temporal)   Wt 220 lb (99.8 kg)   SpO2  97%   BMI 30.68 kg/m  Wt Readings from Last 3 Encounters:  09/23/20 220 lb (99.8 kg)  09/13/20 224 lb (101.6 kg)  12/16/18 233 lb 4 oz (105.8 kg)    General: Appears his stated age, well developed, well nourished in NAD. HEENT: Head: normal shape and size; Eyes: sclera white, no icterus, conjunctiva pink, EOMs intact; Ears: Tm's gray with wrinkling of bilateral TM; left ear canal erythematous near the TM, he has pus/was noted at the TM, no pain with pulling on the pinna or pressure on the tragus; Throat/Mouth: Teeth present, mucosa pink and moist, no exudate, lesions or ulcerations noted.  Neck:  No adenopathy. Cardiovascular: Normal rate and rhythm. Pulmonary/Chest: Normal effort and positive vesicular breath sounds.  Neurological: Alert and oriented.   BMET    Component Value Date/Time   NA 137 04/12/2016 1102   K 4.8 04/12/2016 1102   CL 102 04/12/2016 1102   CO2 28 04/12/2016 1102   GLUCOSE 96 04/12/2016 1102   BUN 17 04/12/2016 1102   CREATININE 1.40 04/12/2016 1102   CALCIUM 10.0 04/12/2016 1102   GFRNONAA 67 (L) 09/13/2012 1118   GFRAA 78 (L) 09/13/2012 1118    Lipid Panel     Component Value Date/Time   CHOL 210 (H) 09/13/2012 1118   TRIG 156 (H) 09/13/2012 1118   HDL 40 09/13/2012 1118   CHOLHDL 5.3 09/13/2012 1118   VLDL 31 09/13/2012 1118   LDLCALC 139 (H) 09/13/2012 1118    CBC    Component Value Date/Time   WBC 15.6 (H) 04/12/2016 1102   RBC 5.17 04/12/2016 1102   HGB 15.4 04/12/2016 1102   HCT 46.4 04/12/2016 1102   PLT 294.0 04/12/2016 1102   MCV 89.8 04/12/2016 1102   MCH 31.5 09/13/2012 1002   MCHC 33.2 04/12/2016 1102   RDW 14.0 04/12/2016 1102   LYMPHSABS 2.5 04/12/2016 1102   MONOABS 1.7 (H) 04/12/2016 1102   EOSABS 0.2 04/12/2016 1102   BASOSABS 0.0 04/12/2016 1102    Hgb A1C Lab Results  Component Value Date   HGBA1C 5.7 (H) 09/13/2012           Assessment & Plan:   Possible Left Otitis Media:  RX for Amoxil 500 mg TID x  10 days for possible ear infection If no improvement, will refer to ENT for wax removal as it is so close to the TM  Return precautions discussed Webb Silversmith, NP This visit occurred during the SARS-CoV-2 public health emergency.  Safety protocols were in place, including screening questions  prior to the visit, additional usage of staff PPE, and extensive cleaning of exam room while observing appropriate contact time as indicated for disinfecting solutions.

## 2020-09-23 NOTE — Patient Instructions (Signed)
Otitis Media, Adult  Otitis media means that the middle ear is red and swollen (inflamed) and full of fluid. The condition usually goes away on its own. Follow these instructions at home:  Take over-the-counter and prescription medicines only as told by your doctor.  If you were prescribed an antibiotic medicine, take it as told by your doctor. Do not stop taking the antibiotic even if you start to feel better.  Keep all follow-up visits as told by your doctor. This is important. Contact a doctor if:  You have bleeding from your nose.  There is a lump on your neck.  You are not getting better in 5 days.  You feel worse instead of better. Get help right away if:  You have pain that is not helped with medicine.  You have swelling, redness, or pain around your ear.  You get a stiff neck.  You cannot move part of your face (paralyzed).  You notice that the bone behind your ear hurts when you touch it.  You get a very bad headache. Summary  Otitis media means that the middle ear is red, swollen, and full of fluid.  This condition usually goes away on its own. In some cases, treatment may be needed.  If you were prescribed an antibiotic medicine, take it as told by your doctor. This information is not intended to replace advice given to you by your health care provider. Make sure you discuss any questions you have with your health care provider. Document Revised: 10/26/2017 Document Reviewed: 12/04/2016 Elsevier Patient Education  2020 Elsevier Inc.  

## 2020-09-27 ENCOUNTER — Encounter: Payer: Self-pay | Admitting: Family Medicine

## 2020-09-27 ENCOUNTER — Other Ambulatory Visit: Payer: Self-pay

## 2020-09-27 ENCOUNTER — Ambulatory Visit: Payer: 59 | Admitting: Family Medicine

## 2020-09-27 VITALS — BP 130/78 | HR 96 | Temp 97.8°F | Ht 71.0 in | Wt 220.5 lb

## 2020-09-27 DIAGNOSIS — H6692 Otitis media, unspecified, left ear: Secondary | ICD-10-CM | POA: Diagnosis not present

## 2020-09-27 DIAGNOSIS — H7292 Unspecified perforation of tympanic membrane, left ear: Secondary | ICD-10-CM

## 2020-09-27 MED ORDER — AMOXICILLIN-POT CLAVULANATE 875-125 MG PO TABS: 1.0000 | ORAL_TABLET | Freq: Two times a day (BID) | ORAL | 0 refills | Status: AC

## 2020-09-27 MED ORDER — OFLOXACIN 0.3 % OT SOLN: 10.0000 [drp] | Freq: Two times a day (BID) | OTIC | 0 refills | Status: AC

## 2020-09-27 NOTE — Progress Notes (Signed)
David Collins T. Bright Spielmann, MD, David Collins at [facility] [place], [address]  Phone: [phone]  FAX: [phone]  David Collins - 54 y.o. male  MRN [id number]  Date of Birth: 1966-09-30  Date: 09/27/2020  PCP: Owens Loffler, MD  Referral: Owens Loffler, MD  Chief Complaint  Patient presents with  . Ear Pain    Left Ear-Seen by Avie Echevaria on 09/23/20    This visit occurred during the SARS-CoV-2 public health emergency.  Safety protocols were in place, including screening questions prior to the visit, additional usage of staff PPE, and extensive cleaning of exam room while observing appropriate contact time as indicated for disinfecting solutions.   Subjective:   David Collins is a 54 y.o. very pleasant male patient with Body mass index is 30.75 kg/m. who presents with the following:  L OM: David Collins is here in follow-up after seeing Mrs. Baity on September 23, 2020.  He does have a significantly painful otitis media on the left, and he does have evidence of perforation.  Is having drainage from the ear, and he is able to make an auditory whistle with blowing his ears.  He has had a perforation in the past distantly as a child.  ? perf  Review of Systems is noted in the HPI, as appropriate  Objective:   BP 130/78   Pulse 96   Temp 97.8 F (36.6 C) (Temporal)   Ht 5\' 11"  (1.803 m)   Wt 220 lb 8 oz (100 kg)   SpO2 96%   BMI 30.75 kg/m   GEN: No acute distress; alert,appropriate. PULM: Breathing comfortably in no respiratory distress PSYCH: Normally interactive.   Right TM appears normal. Left TM there is wetness in the canal, there is mild piece of cerumen near the TM, so this is not entirely visualized.  There is dullness at the TM with a bulging appearance.  I cannot see a perforation.  Laboratory and Imaging Data:  Assessment and Plan:     ICD-10-CM   1. Acute otitis media  of left ear with perforation  H66.92    H72.92    Consistent with persistent otitis media with perforation.  Change from plain amoxicillin to Augmentin.  Additionally, I am going to add some Floxin otic.  With perforation, we discussed that usually these do heal on their own, but if symptoms persist after his pain resolves, then ENT will be involved.  Meds ordered this encounter  Medications  . amoxicillin-clavulanate (AUGMENTIN) 875-125 MG tablet    Sig: Take 1 tablet by mouth 2 (two) times daily for 7 days.    Dispense:  14 tablet    Refill:  0  . ofloxacin (FLOXIN) 0.3 % OTIC solution    Sig: Place 10 drops into the left ear 2 (two) times daily for 14 days.    Dispense:  15 mL    Refill:  0   There are no discontinued medications. No orders of the defined types were placed in this encounter.   Follow-up: No follow-ups on file.  Signed,  Maud Deed. Tarrie Mcmichen, MD   Outpatient Encounter Medications as of 09/27/2020  Medication Sig  . amoxicillin (AMOXIL) 500 MG capsule Take 1 capsule (500 mg total) by mouth 3 (three) times daily.  . Multiple Vitamin (MULTIVITAMIN) tablet Take 1 tablet by mouth daily.  . traMADol (ULTRAM) 50 MG tablet Take 50 mg by mouth every 6 (six)  hours as needed for pain.  Marland Kitchen amoxicillin-clavulanate (AUGMENTIN) 875-125 MG tablet Take 1 tablet by mouth 2 (two) times daily for 7 days.  Marland Kitchen ofloxacin (FLOXIN) 0.3 % OTIC solution Place 10 drops into the left ear 2 (two) times daily for 14 days.   No facility-administered encounter medications on file as of 09/27/2020.

## 2020-09-28 NOTE — Telephone Encounter (Signed)
Pt seen 09/13/20.

## 2020-09-28 NOTE — Telephone Encounter (Signed)
09/03/20 per chart review tab pt seen [place] ED 09/03/20. Pt saw Dr Lorelei Pont on 09/13/20.

## 2020-10-05 ENCOUNTER — Telehealth: Payer: Self-pay | Admitting: Family Medicine

## 2020-10-05 DIAGNOSIS — H6692 Otitis media, unspecified, left ear: Secondary | ICD-10-CM

## 2020-10-05 DIAGNOSIS — H7292 Unspecified perforation of tympanic membrane, left ear: Secondary | ICD-10-CM

## 2020-10-05 MED ORDER — AMOXICILLIN-POT CLAVULANATE 875-125 MG PO TABS: 1.0000 | ORAL_TABLET | Freq: Two times a day (BID) | ORAL | 0 refills | Status: AC

## 2020-10-05 NOTE — Telephone Encounter (Signed)
David Collins notified as instructed by telephone.

## 2020-10-05 NOTE — Telephone Encounter (Signed)
Pt called in wanted to know about getting another refill for the ear ache due to he has been having it for the past couple of weeks. And wanted to know if he needed to come in or get an referral to an ENT. Please advise

## 2020-10-05 NOTE — Telephone Encounter (Signed)
Butch Penny  I sent in abx, and I also thinks he need to see ENT. I made referral, too

## 2020-10-28 DIAGNOSIS — H6993 Unspecified Eustachian tube disorder, bilateral: Secondary | ICD-10-CM | POA: Insufficient documentation

## 2020-10-28 DIAGNOSIS — H906 Mixed conductive and sensorineural hearing loss, bilateral: Secondary | ICD-10-CM | POA: Insufficient documentation

## 2020-10-28 DIAGNOSIS — J342 Deviated nasal septum: Secondary | ICD-10-CM | POA: Insufficient documentation

## 2024-06-16 ENCOUNTER — Ambulatory Visit: Admitting: Dermatology

## 2024-06-16 ENCOUNTER — Encounter: Payer: Self-pay | Admitting: Dermatology

## 2024-06-16 DIAGNOSIS — D492 Neoplasm of unspecified behavior of bone, soft tissue, and skin: Secondary | ICD-10-CM | POA: Diagnosis not present

## 2024-06-16 DIAGNOSIS — D1801 Hemangioma of skin and subcutaneous tissue: Secondary | ICD-10-CM | POA: Diagnosis not present

## 2024-06-16 DIAGNOSIS — L219 Seborrheic dermatitis, unspecified: Secondary | ICD-10-CM

## 2024-06-16 DIAGNOSIS — D485 Neoplasm of uncertain behavior of skin: Secondary | ICD-10-CM

## 2024-06-16 DIAGNOSIS — L738 Other specified follicular disorders: Secondary | ICD-10-CM

## 2024-06-16 DIAGNOSIS — G8929 Other chronic pain: Secondary | ICD-10-CM | POA: Insufficient documentation

## 2024-06-16 DIAGNOSIS — R7989 Other specified abnormal findings of blood chemistry: Secondary | ICD-10-CM | POA: Insufficient documentation

## 2024-06-16 DIAGNOSIS — K760 Fatty (change of) liver, not elsewhere classified: Secondary | ICD-10-CM | POA: Insufficient documentation

## 2024-06-16 NOTE — Progress Notes (Signed)
 New Patient Visit   Subjective  David Collins is a 58 y.o. male who presents for the following: couple spots at face. New spot at right cheek that came up like a blister a few months ago, spot at right nose, present for 1 1/2 years and comes and goes, a spot at right lower face that has bled. No personal hx skin cancer. Patient's father with hx of BCC.   The following portions of the chart were reviewed this encounter and updated as appropriate: medications, allergies, medical history  Review of Systems:  No other skin or systemic complaints except as noted in HPI or Assessment and Plan.  Objective  Well appearing patient in no apparent distress; mood and affect are within normal limits.   A focused examination was performed of the following areas: Face, arms  Relevant exam findings are noted in the Assessment and Plan.  right alar crease 5 mm atrophic papule with hemorrhagic crusting    Assessment & Plan     HEMANGIOMA Exam: red papule(s) L arm Discussed benign nature. Recommend observation. Call for changes.  Sebaceous Hyperplasia - Small yellow papules with a central dell at left cheek - Benign-appearing - Observe. Call for changes.  NEOPLASM OF UNCERTAIN BEHAVIOR OF SKIN (2) right alar crease Skin / nail biopsy Type of biopsy: tangential   Informed consent: discussed and consent obtained   Timeout: patient name, date of birth, surgical site, and procedure verified   Procedure prep:  Patient was prepped and draped in usual sterile fashion Prep type:  Isopropyl alcohol Anesthesia: the lesion was anesthetized in a standard fashion   Anesthetic:  1% lidocaine w/ epinephrine 1-100,000 buffered w/ 8.4% NaHCO3 Instrument used: DermaBlade   Hemostasis achieved with: pressure and aluminum chloride   Outcome: patient tolerated procedure well   Post-procedure details: sterile dressing applied and wound care instructions given   Dressing type: bandage and petrolatum     Specimen 1 - Surgical pathology Differential Diagnosis: r/o BCC vs SCC  Check Margins: No right cheek Destruction of lesion Complexity: simple   Destruction method: cryotherapy   Informed consent: discussed and consent obtained   Timeout:  patient name, date of birth, surgical site, and procedure verified Lesion destroyed using liquid nitrogen: Yes   Region frozen until ice ball extended beyond lesion: Yes   Cryotherapy cycles:  1 (1 or 2) Outcome: patient tolerated procedure well with no complications   Post-procedure details: wound care instructions given   Additional details:  FROZEN AS TEST SPOT. DO NOT BILL  Recommended biopsy at right cheek. Ddx rosacea acne occult BCC. Patient defers bx, prefers LN2. FROZEN AS TEST SPOT. DO NOT BILL. Took photo with arrow pointing to lesion treated. Patient to RTC if not resolved in 1 month.  CHERRY ANGIOMA   SEBACEOUS HYPERPLASIA   SEBORRHEIC DERMATITIS   SEBORRHEIC DERMATITIS Exam: Pink patches with greasy scale at beard nasolabial   Seborrheic Dermatitis is a chronic persistent rash characterized by pinkness and scaling most commonly of the mid face but also can occur on the scalp (dandruff), ears; mid chest, mid back and groin.  It tends to be exacerbated by stress and cooler weather.  People who have neurologic disease may experience new onset or exacerbation of existing seborrheic dermatitis.  The condition is not curable but treatable and can be controlled.  Treatment Plan: Pt defers treatment    Return if symptoms worsen or fail to improve, for pending bx results.  LILLETTE David Collins, RMA, am acting  as scribe for Boneta Sharps, MD .   Documentation: I have reviewed the above documentation for accuracy and completeness, and I agree with the above.  Boneta Sharps, MD

## 2024-06-16 NOTE — Patient Instructions (Signed)

## 2024-06-19 LAB — SURGICAL PATHOLOGY

## 2024-06-20 ENCOUNTER — Ambulatory Visit: Payer: Self-pay | Admitting: Dermatology
